# Patient Record
Sex: Male | Born: 1950 | Race: Black or African American | Hispanic: No | Marital: Single | Smoking: Current every day smoker
Health system: Southern US, Community
[De-identification: ages and names within clinical notes are randomized; demographics above are authoritative.]

## PROBLEM LIST (undated history)

## (undated) DIAGNOSIS — I10 Essential (primary) hypertension: Secondary | ICD-10-CM

## (undated) DIAGNOSIS — M109 Gout, unspecified: Secondary | ICD-10-CM

## (undated) DIAGNOSIS — K219 Gastro-esophageal reflux disease without esophagitis: Secondary | ICD-10-CM

## (undated) DIAGNOSIS — C61 Malignant neoplasm of prostate: Secondary | ICD-10-CM

## (undated) DIAGNOSIS — E785 Hyperlipidemia, unspecified: Secondary | ICD-10-CM

## (undated) DIAGNOSIS — M1990 Unspecified osteoarthritis, unspecified site: Secondary | ICD-10-CM

## (undated) HISTORY — PX: PROSTATE BIOPSY: SHX241

## (undated) HISTORY — PX: OTHER SURGICAL HISTORY: SHX169

---

## 2014-06-15 ENCOUNTER — Ambulatory Visit (INDEPENDENT_AMBULATORY_CARE_PROVIDER_SITE_OTHER): Payer: BC Managed Care – PPO | Admitting: Urology

## 2014-06-15 ENCOUNTER — Other Ambulatory Visit: Payer: Self-pay | Admitting: Urology

## 2014-06-15 DIAGNOSIS — R300 Dysuria: Secondary | ICD-10-CM

## 2014-06-15 DIAGNOSIS — N4340 Spermatocele of epididymis, unspecified: Secondary | ICD-10-CM

## 2014-06-15 DIAGNOSIS — R972 Elevated prostate specific antigen [PSA]: Secondary | ICD-10-CM

## 2014-06-15 DIAGNOSIS — R310 Gross hematuria: Secondary | ICD-10-CM

## 2014-08-10 ENCOUNTER — Other Ambulatory Visit: Payer: Self-pay | Admitting: Urology

## 2014-08-10 DIAGNOSIS — R972 Elevated prostate specific antigen [PSA]: Secondary | ICD-10-CM

## 2014-08-24 ENCOUNTER — Ambulatory Visit (HOSPITAL_COMMUNITY)
Admission: RE | Admit: 2014-08-24 | Discharge: 2014-08-24 | Disposition: A | Discharge status: Home / Self Care | Payer: BC Managed Care – PPO | Source: Ambulatory Visit | Attending: Urology | Admitting: Urology

## 2014-08-24 DIAGNOSIS — R972 Elevated prostate specific antigen [PSA]: Secondary | ICD-10-CM | POA: Insufficient documentation

## 2014-08-24 MED ORDER — LIDOCAINE HCL (PF) 2 % IJ SOLN
INTRAMUSCULAR | Status: AC
  Administered 2014-08-24: 10 mL
  Filled 2014-08-24: qty 10

## 2014-08-24 MED ORDER — GENTAMICIN SULFATE 40 MG/ML IJ SOLN
INTRAMUSCULAR | Status: AC
  Administered 2014-08-24: 160 mg
  Filled 2014-08-24: qty 4

## 2014-08-24 NOTE — Discharge Instructions (Signed)
Transrectal Ultrasound-Guided Biopsy °A transrectal ultrasound-guided biopsy is a procedure to remove samples of tissue from your prostate using ultrasound images to guide the procedure. The procedure is usually done to evaluate the prostate gland of men who have an elevated prostate-specific antigen (PSA). PSA is a blood test to screen for prostate cancer. The biopsy samples are taken to check for prostate cancer.  °LET YOUR HEALTH CARE PROVIDER KNOW ABOUT: °· Any allergies you have. °· All medicines you are taking, including vitamins, herbs, eye drops, creams, and over-the-counter medicines. °· Previous problems you or members of your family have had with the use of anesthetics. °· Any blood disorders you have. °· Previous surgeries you have had. °· Medical conditions you have. °RISKS AND COMPLICATIONS °Generally, this is a safe procedure. However, as with any procedure, problems can occur. Possible problems include: °· Infection of your prostate. °· Bleeding from your rectum or blood in your urine. °· Difficulty urinating. °· Nerve damage (this is usually temporary). °· Damage to surrounding structures such as blood vessels, organs, and muscles, which would require other procedures. °BEFORE THE PROCEDURE °· Do not eat or drink anything after midnight on the night before the procedure or as directed by your health care provider. °· Take medicines only as directed by your health care provider. °· Your health care provider may have you stop taking certain medicines 5-7 days before the procedure. °· You will be given an enema before the procedure. During an enema, a liquid is injected into your rectum to clear out waste. °· You may have lab tests the day of your procedure.   °· Plan to have someone take you home after the procedure. °PROCEDURE  °· You will be given medicine to help you relax (sedative) before the procedure. An IV tube will be inserted into one of your veins and used to give fluids and  medicine. °· You will be given antibiotic medicine to reduce the risk of an infection. °· You will be placed on your side for the procedure. °· A probe with lubricated gel will be placed into your rectum, and images will be taken of your prostate and surrounding structures. °· Numbing medicine will be injected into the prostate before the biopsy samples are taken. °· A biopsy needle will then be inserted and guided to your prostate with the use of the ultrasound images. °· Samples of prostate tissue will be taken, and the needle will then be removed. °· The biopsy samples will be sent to a lab to be analyzed. Results are usually back in 2-3 days. °AFTER THE PROCEDURE °· You will be taken to a recovery area where you will be monitored. °· You may have some discomfort in the rectal area. You will be given pain medicines to control this. °· You may be allowed to go home the same day, or you may need to stay in the hospital overnight. °Document Released: 12/18/2013 Document Reviewed: 03/22/2013 °Exit[employer]® Patient Information ©2015 [employer]. This information is not intended to replace advice given to you by your health care provider. Make sure you discuss any questions you have with your health care provider. ° °

## 2014-08-31 ENCOUNTER — Ambulatory Visit (INDEPENDENT_AMBULATORY_CARE_PROVIDER_SITE_OTHER): Payer: BC Managed Care – PPO | Admitting: Urology

## 2014-08-31 DIAGNOSIS — N402 Nodular prostate without lower urinary tract symptoms: Secondary | ICD-10-CM

## 2014-08-31 DIAGNOSIS — R972 Elevated prostate specific antigen [PSA]: Secondary | ICD-10-CM

## 2015-11-13 ENCOUNTER — Encounter: Payer: Self-pay | Admitting: Orthopaedic Surgery

## 2015-11-13 ENCOUNTER — Ambulatory Visit (INDEPENDENT_AMBULATORY_CARE_PROVIDER_SITE_OTHER): Payer: BC Managed Care – PPO

## 2015-11-13 ENCOUNTER — Ambulatory Visit (INDEPENDENT_AMBULATORY_CARE_PROVIDER_SITE_OTHER): Payer: BC Managed Care – PPO | Admitting: Orthopaedic Surgery

## 2015-11-13 VITALS — BP 155/76 | HR 83 | Temp 98.1°F | Resp 14 | Ht 74.0 in | Wt 191.0 lb

## 2015-11-13 DIAGNOSIS — M25551 Pain in right hip: Secondary | ICD-10-CM | POA: Diagnosis not present

## 2015-11-13 DIAGNOSIS — M899 Disorder of bone, unspecified: Secondary | ICD-10-CM | POA: Diagnosis not present

## 2015-11-13 NOTE — Patient Instructions (Signed)
WE WILL SCHEDULE MRI FOR YOU AND CALL YOU WITH APPOINTMENT

## 2015-11-13 NOTE — Progress Notes (Signed)
Subjective: right hip pain    Patient ID: Donald Pollard, male    DOB: 1951/05/24, 65 y.o.   MRN: [id number]  Leg Pain  The incident occurred more than 1 week ago. There was no injury mechanism. The pain is present in the right hip. The quality of the pain is described as aching. The pain is at a severity of 2/10. The pain is mild. The pain has been fluctuating since onset. Pertinent negatives include no loss of sensation, muscle weakness, numbness or tingling. The symptoms are aggravated by weight bearing. He has tried nothing for the symptoms. The treatment provided mild relief.   He has had intermittent pain in the right hip for several months.  He was seen by his family doctor, [facility], on 10/16/15.  X-rays then showed a rounded lucency in right femoral head with sclerotic margins.  Possibility of neoplasm was raised.  Orthopaedic referral was advised.  He has no trauma.  His hip feels well the last couple of weeks.  When he had the pain it was a 7 of a 10. It was deep and throbbing.   Review of Systems  Constitutional:       He does not have diabetes. He has hypertension. He has no shortness of breath. He smokes regularly.   HENT: Negative for congestion.   Respiratory: Negative for cough and shortness of breath.   Cardiovascular: Negative for chest pain and leg swelling.  Endocrine: Negative for cold intolerance.  Musculoskeletal: Positive for arthralgias and gait problem.  Allergic/Immunologic: Negative for environmental allergies.  Neurological: Negative for tingling and numbness.   No past medical history on file. No past surgical history on file.  Social History   Social History  . Marital Status: Divorced    Spouse Name: N/A  . Number of Children: N/A  . Years of Education: N/A   Occupational History  . Not on file.   Social History Main Topics  . Smoking status: Current Every Day Smoker  . Smokeless tobacco: Never Used  . Alcohol Use: No  .  Drug Use: No  . Sexual Activity:    Partners: Female   Other Topics Concern  . Not on file   Social History Narrative  . No narrative on file   BP 155/76 mmHg  Pulse 83  Temp(Src) 98.1 F (36.7 C) (Tympanic)  Resp 14  Ht 6\' 2"  (1.88 m)  Wt 191 lb (86.637 kg)  BMI 24.51 kg/m2     Objective:   Physical Exam  Constitutional: He is oriented to person, place, and time. He appears well-developed and well-nourished.  HENT:  Head: Normocephalic and atraumatic.  Eyes: Conjunctivae and EOM are normal. Pupils are equal, round, and reactive to light.  Neck: Normal range of motion. Neck supple.  Cardiovascular: Normal rate, regular rhythm and intact distal pulses.   Pulmonary/Chest: Effort normal.  Abdominal: Soft.  Musculoskeletal: Normal range of motion.  He has no pain in the right hip and full ROM.  No pain of the trochanteric area.  Neurological: He is alert and oriented to person, place, and time. He has normal reflexes. No cranial nerve deficit. He exhibits normal muscle tone. Coordination normal.  Skin: Skin is warm and dry.  Psychiatric: He has a normal mood and affect. His behavior is normal. Judgment and thought content normal.   Encounter Diagnoses  Name Primary?  . Right hip pain Yes  . Bone lesion       Assessment & Plan:  I have shown him the x-rays of the right hip done today in the office.  I showed him the bony lesion.  I have explained possible seriousness of this.  I have ordered a MRI of the hip.  He may need biopsy or possible surgery depending on findings.  He appears to understand.  Return after the MRI.

## 2015-11-28 ENCOUNTER — Ambulatory Visit (HOSPITAL_COMMUNITY)
Admission: RE | Admit: 2015-11-28 | Discharge: 2015-11-28 | Disposition: A | Discharge status: Home / Self Care | Payer: BC Managed Care – PPO | Source: Ambulatory Visit | Attending: Orthopaedic Surgery | Admitting: Orthopaedic Surgery

## 2015-11-28 DIAGNOSIS — M899 Disorder of bone, unspecified: Secondary | ICD-10-CM

## 2015-12-03 ENCOUNTER — Ambulatory Visit: Payer: BC Managed Care – PPO | Admitting: Orthopaedic Surgery

## 2015-12-20 ENCOUNTER — Ambulatory Visit (INDEPENDENT_AMBULATORY_CARE_PROVIDER_SITE_OTHER): Payer: BC Managed Care – PPO | Admitting: Urology

## 2015-12-20 DIAGNOSIS — N4340 Spermatocele of epididymis, unspecified: Secondary | ICD-10-CM

## 2015-12-20 DIAGNOSIS — N402 Nodular prostate without lower urinary tract symptoms: Secondary | ICD-10-CM | POA: Diagnosis not present

## 2015-12-20 DIAGNOSIS — R972 Elevated prostate specific antigen [PSA]: Secondary | ICD-10-CM

## 2016-01-06 ENCOUNTER — Ambulatory Visit (HOSPITAL_COMMUNITY)
Admission: RE | Admit: 2016-01-06 | Discharge: 2016-01-06 | Disposition: A | Discharge status: Home / Self Care | Payer: BC Managed Care – PPO | Source: Ambulatory Visit | Attending: Orthopaedic Surgery | Admitting: Orthopaedic Surgery

## 2016-01-06 DIAGNOSIS — S76011A Strain of muscle, fascia and tendon of right hip, initial encounter: Secondary | ICD-10-CM | POA: Insufficient documentation

## 2016-01-06 DIAGNOSIS — M948X5 Other specified disorders of cartilage, thigh: Secondary | ICD-10-CM | POA: Insufficient documentation

## 2016-01-06 DIAGNOSIS — M899 Disorder of bone, unspecified: Secondary | ICD-10-CM | POA: Diagnosis not present

## 2016-01-06 LAB — POCT I-STAT CREATININE: CREATININE: 1.3 mg/dL — AB (ref 0.61–1.24)

## 2016-01-06 MED ORDER — GADOBENATE DIMEGLUMINE 529 MG/ML IV SOLN
18.0000 mL | Freq: Once | INTRAVENOUS | Status: AC | PRN
  Administered 2016-01-06: 18 mL via INTRAVENOUS

## 2016-01-15 ENCOUNTER — Ambulatory Visit (INDEPENDENT_AMBULATORY_CARE_PROVIDER_SITE_OTHER): Payer: BC Managed Care – PPO | Admitting: Orthopaedic Surgery

## 2016-01-15 ENCOUNTER — Encounter: Payer: Self-pay | Admitting: Orthopaedic Surgery

## 2016-01-15 VITALS — BP 136/72 | HR 79 | Temp 97.9°F | Ht 74.0 in | Wt 191.0 lb

## 2016-01-15 DIAGNOSIS — M25551 Pain in right hip: Secondary | ICD-10-CM

## 2016-01-15 DIAGNOSIS — M899 Disorder of bone, unspecified: Secondary | ICD-10-CM

## 2016-01-15 NOTE — Patient Instructions (Signed)
Results of the MRI were discussed. Continue medications. Continue reduced activity.

## 2016-01-15 NOTE — Progress Notes (Signed)
Patient [id number] [name], male DOB:Feb 25, 1951, 65 y.o. [id number]  Chief Complaint  Patient presents with  . Results    MRI Right hip    HPI  Donald Pollard is a 65 y.o. male who is seen in follow-up for right hip pain and bony lesion.  He had a MRI which showed:  IMPRESSION: 1. 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 2. Tendinosis with high-grade partial tear of the right hamstring origin. Tendinosis with a small partial tear of the left hamstring origin. 3. Partial-thickness cartilage loss of the hips bilaterally. 4. Degeneration with possible tear of the superior right labrum.  I have explained the findings to him.  He is not having any pain.  He does not need any surgery.  I will see him in one month. HPI  Body mass index is 24.51 kg/(m^2).  ROS  Review of Systems  Constitutional:       He does not have diabetes. He has hypertension. He has no shortness of breath. He smokes regularly.   HENT: Negative for congestion.   Respiratory: Negative for cough and shortness of breath.   Cardiovascular: Negative for chest pain and leg swelling.  Endocrine: Negative for cold intolerance.  Musculoskeletal: Positive for arthralgias and gait problem.  Allergic/Immunologic: Negative for environmental allergies.  Neurological: Negative for numbness.    History reviewed. No pertinent past medical history.  History reviewed. No pertinent past surgical history.  History reviewed. No pertinent family history.  Social History Social History  Substance Use Topics  . Smoking status: Current Every Day Smoker  . Smokeless tobacco: Never Used  . Alcohol Use: No    No Known Allergies  Current Outpatient Prescriptions  Medication Sig Dispense Refill  . aspirin 81 MG tablet Take 81 mg by mouth daily.    . hydrochlorothiazide  (HYDRODIURIL) 25 MG tablet Take 25 mg by mouth daily.    Marland Kitchen lisinopril (PRINIVIL,ZESTRIL) 10 MG tablet Take 10 mg by mouth daily.    Marland Kitchen lovastatin (MEVACOR) 20 MG tablet Take 20 mg by mouth daily.     No current facility-administered medications for this visit.     Physical Exam  Blood pressure 136/72, pulse 79, temperature 97.9 F (36.6 C), height 6\' 2"  (1.88 m), weight 191 lb (86.637 kg).  Constitutional: overall normal hygiene, normal nutrition, well developed, normal grooming, normal body habitus. Assistive device:none  Musculoskeletal: gait and station Limp none, muscle tone and strength are normal, no tremors or atrophy is present.  .  Neurological: coordination overall normal.  Deep tendon reflex/nerve stretch intact.  Sensation normal.  Cranial nerves II-XII intact.   Skin:   normal overall no scars, lesions, ulcers or rashes. No psoriasis.  Psychiatric: Alert and oriented x 3.  Recent memory intact, remote memory unclear.  Normal mood and affect. Well groomed.  Good eye contact.  Cardiovascular: overall no swelling, no varicosities, no edema bilaterally, normal temperatures of the legs and arms, no clubbing, cyanosis and good capillary refill.  Lymphatic: palpation is normal.  Both hips have no pain, both have full motion, NV intact.  The patient has been educated about the nature of the problem(s) and counseled on treatment options.  The patient appeared to understand what I have discussed and is in agreement with it.  Encounter Diagnoses  Name Primary?  . Right hip pain Yes  . Bone lesion     PLAN Call  if any problems.  Precautions discussed.  Continue current medications.   Return to clinic 1 month

## 2016-03-12 ENCOUNTER — Ambulatory Visit: Payer: BC Managed Care – PPO | Admitting: Orthopaedic Surgery

## 2016-03-18 ENCOUNTER — Ambulatory Visit: Payer: BC Managed Care – PPO | Admitting: Orthopaedic Surgery

## 2016-08-21 ENCOUNTER — Encounter (INDEPENDENT_AMBULATORY_CARE_PROVIDER_SITE_OTHER): Payer: Self-pay | Admitting: *Deleted

## 2017-02-12 ENCOUNTER — Ambulatory Visit: Payer: BC Managed Care – PPO | Admitting: Urology

## 2017-03-26 ENCOUNTER — Ambulatory Visit (INDEPENDENT_AMBULATORY_CARE_PROVIDER_SITE_OTHER): Payer: Medicare Other | Admitting: Urology

## 2017-03-26 DIAGNOSIS — N403 Nodular prostate with lower urinary tract symptoms: Secondary | ICD-10-CM

## 2017-03-26 DIAGNOSIS — R351 Nocturia: Secondary | ICD-10-CM | POA: Diagnosis not present

## 2017-03-26 DIAGNOSIS — R972 Elevated prostate specific antigen [PSA]: Secondary | ICD-10-CM

## 2017-03-29 ENCOUNTER — Other Ambulatory Visit: Payer: Self-pay | Admitting: Urology

## 2017-03-29 DIAGNOSIS — R972 Elevated prostate specific antigen [PSA]: Secondary | ICD-10-CM

## 2017-04-07 ENCOUNTER — Ambulatory Visit (HOSPITAL_COMMUNITY)
Admission: RE | Admit: 2017-04-07 | Discharge: 2017-04-07 | Disposition: A | Discharge status: Home / Self Care | Payer: Medicare Other | Source: Ambulatory Visit | Attending: Urology | Admitting: Urology

## 2017-04-07 DIAGNOSIS — R972 Elevated prostate specific antigen [PSA]: Secondary | ICD-10-CM

## 2017-04-07 DIAGNOSIS — R938 Abnormal findings on diagnostic imaging of other specified body structures: Secondary | ICD-10-CM | POA: Diagnosis not present

## 2017-04-07 DIAGNOSIS — N400 Enlarged prostate without lower urinary tract symptoms: Secondary | ICD-10-CM | POA: Insufficient documentation

## 2017-04-07 LAB — POCT I-STAT CREATININE: Creatinine, Ser: 1.4 mg/dL — ABNORMAL HIGH (ref 0.61–1.24)

## 2017-04-07 MED ORDER — GADOBENATE DIMEGLUMINE 529 MG/ML IV SOLN
18.0000 mL | Freq: Once | INTRAVENOUS | Status: AC | PRN
  Administered 2017-04-07: 18 mL via INTRAVENOUS

## 2017-06-04 ENCOUNTER — Ambulatory Visit (INDEPENDENT_AMBULATORY_CARE_PROVIDER_SITE_OTHER): Payer: Medicare Other | Admitting: Urology

## 2017-06-04 DIAGNOSIS — N403 Nodular prostate with lower urinary tract symptoms: Secondary | ICD-10-CM

## 2017-06-04 DIAGNOSIS — R972 Elevated prostate specific antigen [PSA]: Secondary | ICD-10-CM | POA: Diagnosis not present

## 2017-07-23 ENCOUNTER — Ambulatory Visit: Payer: Medicare Other | Admitting: Urology

## 2017-07-23 DIAGNOSIS — C61 Malignant neoplasm of prostate: Secondary | ICD-10-CM

## 2017-08-18 ENCOUNTER — Encounter: Payer: Self-pay | Admitting: Radiation Oncology

## 2017-08-18 ENCOUNTER — Other Ambulatory Visit: Payer: Self-pay

## 2017-08-18 ENCOUNTER — Ambulatory Visit
Admission: RE | Admit: 2017-08-18 | Discharge: 2017-08-18 | Disposition: A | Discharge status: Home / Self Care | Payer: Medicare Other | Source: Ambulatory Visit | Attending: Radiation Oncology | Admitting: Radiation Oncology

## 2017-08-18 VITALS — BP 139/78 | HR 99 | Temp 98.1°F | Resp 16 | Wt 193.8 lb

## 2017-08-18 DIAGNOSIS — C61 Malignant neoplasm of prostate: Secondary | ICD-10-CM | POA: Diagnosis not present

## 2017-08-18 DIAGNOSIS — Z7982 Long term (current) use of aspirin: Secondary | ICD-10-CM | POA: Insufficient documentation

## 2017-08-18 DIAGNOSIS — Z809 Family history of malignant neoplasm, unspecified: Secondary | ICD-10-CM | POA: Insufficient documentation

## 2017-08-18 DIAGNOSIS — Z79899 Other long term (current) drug therapy: Secondary | ICD-10-CM | POA: Insufficient documentation

## 2017-08-18 DIAGNOSIS — I10 Essential (primary) hypertension: Secondary | ICD-10-CM | POA: Diagnosis not present

## 2017-08-18 DIAGNOSIS — F17210 Nicotine dependence, cigarettes, uncomplicated: Secondary | ICD-10-CM | POA: Insufficient documentation

## 2017-08-18 DIAGNOSIS — E785 Hyperlipidemia, unspecified: Secondary | ICD-10-CM | POA: Insufficient documentation

## 2017-08-18 HISTORY — DX: Essential (primary) hypertension: I10

## 2017-08-18 HISTORY — DX: Hyperlipidemia, unspecified: E78.5

## 2017-08-18 HISTORY — DX: Malignant neoplasm of prostate: C61

## 2017-08-18 NOTE — Progress Notes (Signed)
GU Location of Tumor / Histology: prostatic adenocarcinoma  If Prostate Cancer, Gleason Score is (3 + 4) and PSA is (10.9). Prostate volume: 58 ml  Donald Pollard had a prostate biopsy in 2016 but it was negative. Repeat prostate biopsy wasn't done until 2018.  Biopsies of prostate (if applicable) revealed:    Past/Anticipated interventions by urology, if any: biopsy, denies receiving ADT or casodex, reports he is most interest in radioactive seeds.   Past/Anticipated interventions by medical oncology, if any: no  Weight changes, if any: no  Bowel/Bladder complaints, if any: IPSS 10. Denies dysuria or hematuria. Reports occasional leakage.   Nausea/Vomiting, if any: no  Pain issues, if any:  Reports occasional sharp pain in right groin that is brief and mild.  SAFETY ISSUES:  Prior radiation? no  Pacemaker/ICD? no  Possible current pregnancy? no  Is the patient on methotrexate? no  Current Complaints / other details:  67 year old male. Retired. Divorced. Accompanied today by his three daughters. Lives in [place].

## 2017-08-18 NOTE — Addendum Note (Signed)
Encounter addended by: Romeka Scifres, MD on: 08/18/2017 1:41 PM  Actions taken: LOS modified, Follow-up modified

## 2017-08-18 NOTE — Progress Notes (Signed)
Radiation Oncology         [phone] ________________________________  Initial Outpatient Consultation  Name: Donald Pollard MRN: [id number]  Date: 08/18/2017  DOB: 13-Nov-1950  DX:[id number], Thomasena Edis, PA-C  [place], [place]*   REFERRING PHYSICIAN: Avelino Leeds*  DIAGNOSIS: 67 y.o. 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 Malignant neoplasm of prostate ([place]) C61     HISTORY OF PRESENT ILLNESS::Donald Pollard is a 67 y.o. gentleman with a diagnosis of prostate cancer. He previously had a prostate biopsy done in January 2016 for an elevated PSA of 8.1. Biopsy was negative for carcinoma. Repeat PSA in December 2016 was 6 but more recently, it is elevated again to 10.9 as of July 2018.  Accordingly, he was referred for evaluation in urology by Dr. Irine Seal on 03/26/2017, where a digital rectal examination was performed at that time revealing mild firmness at the right apex and left mid lateral prostate. He underwent an MRI of the prostate on 04/07/2017 and was found to have a PIRADS 3 lesion in the left mid anterio-lateral prostate. The patient then proceeded to MRI-fusion biopsy with 13 biopsies of the prostate on 06/24/2017.  The prostate volume measured 58 cc.  Out of 13 core biopsies, 1 was positive.  The maximum Gleason score was 3+4, and this was seen in the right base.  Biopsies of prostate revealed:    The patient reviewed the biopsy results with his urologist and he has kindly been referred today for discussion of potential radiation treatment options. He is accompanied by his three daughters.   PREVIOUS RADIATION THERAPY: No  PAST MEDICAL HISTORY:  has a past medical history of Hyperlipidemia, Hypertension, and Prostate cancer ([place]).    PAST SURGICAL HISTORY: Past Surgical History:  Procedure Laterality Date  . PROSTATE BIOPSY      FAMILY HISTORY: family history includes  Cancer in his brother and brother.  SOCIAL HISTORY:  reports that he has been smoking cigarettes.  He has a 78.00 pack-year smoking history. he has never used smokeless tobacco. He reports that he does not drink alcohol or use drugs.  ALLERGIES: Patient has no known allergies.  MEDICATIONS:  Current Outpatient Medications  Medication Sig Dispense Refill  . aspirin 81 MG tablet Take 81 mg by mouth daily.    . hydrochlorothiazide (HYDRODIURIL) 25 MG tablet Take 25 mg by mouth daily.    Marland Kitchen lisinopril (PRINIVIL,ZESTRIL) 10 MG tablet Take 10 mg by mouth daily.    Marland Kitchen lovastatin (MEVACOR) 20 MG tablet Take 20 mg by mouth daily.    . saw palmetto 160 MG capsule Take 160 mg by mouth 2 (two) times daily.    . diazepam (VALIUM) 10 MG tablet Take 10 mg by mouth every 6 (six) hours as needed for anxiety.     No current facility-administered medications for this encounter.     REVIEW OF SYSTEMS:  On review of systems, the patient reports that he is doing well overall. He denies any chest pain, shortness of breath, cough, fevers, chills, night sweats, or unintended weight changes. He denies any bowel disturbances, and denies abdominal pain, nausea or vomiting. He reports occasional sharp pain in his right groin that is brief and mild. He denies any new skin lesions or concerns.  The patient completed an IPSS and IIEF questionnaire.  His IPSS score was 10 indicating moderate urinary outflow obstructive  symptoms of nocturia x4, frequency, intermittency, straining, and occasional leakage. He denies any dysuria or hematuria. He indicated mild-moderate ED. A complete review of systems is obtained and is otherwise negative.   PHYSICAL EXAM: This patient is in no acute distress.  He is alert and oriented.   weight is 193 lb 12.8 oz (87.9 kg). His oral temperature is 98.1 F (36.7 C). His blood pressure is 139/78 and his pulse is 99. His respiration is 16 and oxygen saturation is 100%.  He exhibits no respiratory  distress or labored breathing.  He appears neurologically intact.  His mood is pleasant.  His affect is appropriate.  Please note the digital rectal exam findings described above.  KPS = 100  100 - Normal; no complaints; no evidence of disease. 90   - Able to carry on normal activity; minor signs or symptoms of disease. 80   - Normal activity with effort; some signs or symptoms of disease. 42   - Cares for self; unable to carry on normal activity or to do active work. 60   - Requires occasional assistance, but is able to care for most of his personal needs. 50   - Requires considerable assistance and frequent medical care. 6   - Disabled; requires special care and assistance. 48   - Severely disabled; hospital admission is indicated although death not imminent. 63   - Very sick; hospital admission necessary; active supportive treatment necessary. 10   - Moribund; fatal processes progressing rapidly. 0     - Dead  Karnofsky DA, Abelmann WH, Craver LS and Burchenal JH [phone]) The use of the nitrogen mustards in the palliative treatment of carcinoma: with particular reference to bronchogenic carcinoma Cancer 1 [phone]   LABORATORY DATA:  No results found for: WBC, HGB, HCT, MCV, PLT No results found for: NA, K, CL, CO2 No results found for: ALT, AST, GGT, ALKPHOS, BILITOT   RADIOGRAPHY: No results found.    IMPRESSION: This is a 67 y.o. 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  His T-Stage, Gleason's Score, and PSA put him into the intermediate risk group. Accordingly, he is eligible for a variety of potential treatment options including brachytherapy or 8 weeks of external radiation. We discussed the available radiation techniques, and focused on the details and logistics and delivery. We discussed and outlined the risks, benefits, short and long-term effects associated with radiotherapy and compared and contrasted these with  prostatectomy. We discussed the role of SpaceOAR in reducing the rectal toxicity associated with radiotherapy.  PLAN: At the conclusion of our conversation, the patient is interested in moving forward with brachytherapy and use of SpaceOAR to reduce rectal toxicity from radiotherapy.  We will share our discussion with Dr. Jeffie Pollock and move forward with scheduling his [facility] planning appointment in the near future.  The patient met briefly with Romie Jumper in our office who will be working closely with him to coordinate OR scheduling and pre and post procedure appointments.  We will contact the pharmaceutical rep to ensure that [place] is available at the time of procedure.  He will have a prostate MRI following his post-seed CT SIM to confirm appropriate distribution of the [place].  I spent more than 50% of our time in counseling and/or coordination of care.   ------------------------------------------------  Sheral Apley. Tammi Klippel, M.D.  This document serves as a record of services personally performed by Tyler Pita, MD. It was  created on his behalf by Rae Lips, a trained medical scribe. The creation of this record is based on the scribe's personal observations and the provider's statements to them. This document has been checked and approved by the attending provider.

## 2017-08-18 NOTE — Progress Notes (Signed)
See progress note under physician encounter. 

## 2017-08-20 ENCOUNTER — Telehealth: Payer: Self-pay | Admitting: *Deleted

## 2017-08-20 NOTE — Telephone Encounter (Signed)
CALLED PATIENT TO INFORM OF PRE-SEED PLANNING CT FOR 09-10-17 @ 2 PM, SPOKE WITH PATIENT AND HE IS AWARE OF THIS APPT.

## 2017-08-24 ENCOUNTER — Other Ambulatory Visit: Payer: Self-pay | Admitting: Urology

## 2017-08-24 ENCOUNTER — Telehealth: Payer: Self-pay | Admitting: *Deleted

## 2017-08-24 NOTE — Telephone Encounter (Signed)
Called patient to inform of implant date, spoke with patient and he is aware of this date. 

## 2017-09-09 ENCOUNTER — Telehealth: Payer: Self-pay | Admitting: *Deleted

## 2017-09-09 NOTE — Telephone Encounter (Signed)
CALLED PATIENT TO REMIND OF PRE-SEED APPTS. FOR 09-10-17, SPOKE WITH PATIENT AND HE IS AWARE OF THESE APPTS. 

## 2017-09-10 ENCOUNTER — Ambulatory Visit
Admission: RE | Admit: 2017-09-10 | Discharge: 2017-09-10 | Disposition: A | Discharge status: Home / Self Care | Payer: Medicare Other | Source: Ambulatory Visit | Attending: Radiation Oncology | Admitting: Radiation Oncology

## 2017-09-10 ENCOUNTER — Ambulatory Visit (HOSPITAL_COMMUNITY)
Admission: RE | Admit: 2017-09-10 | Discharge: 2017-09-10 | Disposition: A | Discharge status: Home / Self Care | Payer: Medicare Other | Source: Ambulatory Visit | Attending: Urology | Admitting: Urology

## 2017-09-10 ENCOUNTER — Other Ambulatory Visit: Payer: Self-pay | Admitting: Urology

## 2017-09-10 ENCOUNTER — Encounter (HOSPITAL_COMMUNITY)
Admission: RE | Admit: 2017-09-10 | Discharge: 2017-09-10 | Disposition: A | Discharge status: Home / Self Care | Payer: Medicare Other | Source: Ambulatory Visit | Attending: Urology | Admitting: Urology

## 2017-09-10 DIAGNOSIS — C61 Malignant neoplasm of prostate: Secondary | ICD-10-CM | POA: Diagnosis not present

## 2017-09-10 DIAGNOSIS — Z01818 Encounter for other preprocedural examination: Secondary | ICD-10-CM | POA: Insufficient documentation

## 2017-09-10 NOTE — Progress Notes (Signed)
  Radiation Oncology         [phone]) [phone] ________________________________  Name: Donald Pollard MRN: [id number]  Date: 09/10/2017  DOB: 1950/11/24  SIMULATION AND TREATMENT PLANNING NOTE PUBIC ARCH STUDY  UO:[id number], Thomasena Edis, PA-C  Avelino Leeds*  DIAGNOSIS: 67 y.o. 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 Malignant neoplasm of prostate ([place]) C61     COMPLEX SIMULATION:  The patient presented today for evaluation for possible prostate seed implant. He was brought to the radiation planning suite and placed supine on the CT couch. A 3-dimensional image study set was obtained in upload to the planning computer. There, on each axial slice, I contoured the prostate gland. Then, using three-dimensional radiation planning tools I reconstructed the prostate in view of the structures from the transperineal needle pathway to assess for possible pubic arch interference. In doing so, I did not appreciate any pubic arch interference. Also, the patient's prostate volume was estimated based on the drawn structure. The volume was 59 cc.  Given the pubic arch appearance and prostate volume, patient remains a good candidate to proceed with prostate seed implant. Today, he freely provided informed written consent to proceed.    PLAN: The patient will undergo definitive prostate seed implant to 145 Gy   ________________________________  Sheral Apley. Tammi Klippel, M.D.

## 2017-09-17 ENCOUNTER — Telehealth: Payer: Self-pay | Admitting: Medical Oncology

## 2017-09-17 NOTE — Telephone Encounter (Signed)
Spoke with Donald Pollard and he states he is ready to get the brachytherapy behind him. He was pre-op and pre CT simulation 09/10/17. All questions were answered and I asked him to call me if any questions or concerns arise between now and 11/11/17 day of seed implant. He voiced understanding.

## 2017-10-19 ENCOUNTER — Telehealth: Payer: Self-pay | Admitting: *Deleted

## 2017-10-19 NOTE — Telephone Encounter (Signed)
CALLED PATIENT TO INFORM OF LAB APPT. FOR 11-04-17 - ARRIVAL TIME - 1:45 PM @ [facility] ADMITTING, SPOKE WITH PATIENT AND HE IS AWARE OF THIS APPT.

## 2017-11-01 ENCOUNTER — Encounter (HOSPITAL_BASED_OUTPATIENT_CLINIC_OR_DEPARTMENT_OTHER): Payer: Self-pay | Admitting: *Deleted

## 2017-11-01 ENCOUNTER — Other Ambulatory Visit: Payer: Self-pay

## 2017-11-01 NOTE — Progress Notes (Addendum)
Spoke with [name] after midnight arrive 530 am 11-11-17 [facility] meds to take, none, fleets enema am of surgery Driver daughter ainsley sanguinetti will stay for surgery Patient aware has lab appointment 11-04-17 for cbc, cmet, pt, ptt,  Has ekg and chest xray done 09-10-17 on chart/epic

## 2017-11-03 ENCOUNTER — Telehealth: Payer: Self-pay | Admitting: *Deleted

## 2017-11-03 NOTE — Telephone Encounter (Signed)
Called patient to remind of labs for 11-04-17 @ 2 pm @ [facility] , spoke with patient and he is aware of this appt.

## 2017-11-04 ENCOUNTER — Encounter (HOSPITAL_COMMUNITY)
Admission: RE | Admit: 2017-11-04 | Discharge: 2017-11-04 | Disposition: A | Discharge status: Home / Self Care | Payer: Medicare Other | Source: Ambulatory Visit | Attending: Urology | Admitting: Urology

## 2017-11-04 DIAGNOSIS — Z01812 Encounter for preprocedural laboratory examination: Secondary | ICD-10-CM | POA: Diagnosis present

## 2017-11-04 LAB — COMPREHENSIVE METABOLIC PANEL
ALK PHOS: 82 U/L (ref 38–126)
ALT: 31 U/L (ref 17–63)
ANION GAP: 9 (ref 5–15)
AST: 26 U/L (ref 15–41)
Albumin: 4.1 g/dL (ref 3.5–5.0)
BILIRUBIN TOTAL: 0.5 mg/dL (ref 0.3–1.2)
BUN: 25 mg/dL — ABNORMAL HIGH (ref 6–20)
CALCIUM: 9.4 mg/dL (ref 8.9–10.3)
CO2: 25 mmol/L (ref 22–32)
Chloride: 107 mmol/L (ref 101–111)
Creatinine, Ser: 1.32 mg/dL — ABNORMAL HIGH (ref 0.61–1.24)
GFR, EST NON AFRICAN AMERICAN: 55 mL/min — AB (ref >60)
Glucose, Bld: 79 mg/dL (ref 65–99)
Potassium: 4.8 mmol/L (ref 3.5–5.1)
Sodium: 141 mmol/L (ref 135–145)
TOTAL PROTEIN: 7.2 g/dL (ref 6.5–8.1)

## 2017-11-04 LAB — CBC
HEMATOCRIT: 39.5 % (ref 39.0–52.0)
HEMOGLOBIN: 12.9 g/dL — AB (ref 13.0–17.0)
MCH: 30.1 pg (ref 26.0–34.0)
MCHC: 32.7 g/dL (ref 30.0–36.0)
MCV: 92.3 fL (ref 78.0–100.0)
Platelets: 286 10*3/uL (ref 150–400)
RBC: 4.28 MIL/uL (ref 4.22–5.81)
RDW: 15.1 % (ref 11.5–15.5)
WBC: 10.6 10*3/uL — ABNORMAL HIGH (ref 4.0–10.5)

## 2017-11-04 LAB — PROTIME-INR
INR: 0.98
Prothrombin Time: 12.9 seconds (ref 11.4–15.2)

## 2017-11-04 LAB — APTT: aPTT: 32 seconds (ref 24–36)

## 2017-11-10 ENCOUNTER — Telehealth: Payer: Self-pay | Admitting: *Deleted

## 2017-11-10 NOTE — Telephone Encounter (Signed)
Called patient to remind of procedure for 11-11-17, spoke with patient and he is aware of this procedure

## 2017-11-10 NOTE — Anesthesia Preprocedure Evaluation (Addendum)
Anesthesia Evaluation  Patient identified by MRN, date of birth, ID band Patient awake    Reviewed: Allergy & Precautions, NPO status , Patient's Chart, lab work & pertinent test results  Airway Mallampati: II  TM Distance: >3 FB Neck ROM: Full    Dental no notable dental hx. (+) Edentulous Upper, Missing Only one lower incisor  :   Pulmonary neg pulmonary ROS, Current Smoker,    Pulmonary exam normal breath sounds clear to auscultation       Cardiovascular hypertension, Pt. on medications Normal cardiovascular exam Rhythm:Regular Rate:Normal     Neuro/Psych negative neurological ROS  negative psych ROS   GI/Hepatic Neg liver ROS, GERD  ,  Endo/Other  negative endocrine ROS  Renal/GU negative Renal ROS  negative genitourinary   Musculoskeletal  (+) Arthritis ,   Abdominal   Peds negative pediatric ROS (+)  Hematology negative hematology ROS (+)   Anesthesia Other Findings   Reproductive/Obstetrics negative OB ROS                            Anesthesia Physical Anesthesia Plan  ASA: II  Anesthesia Plan: General   Post-op Pain Management:    Induction: Intravenous  PONV Risk Score and Plan:   Airway Management Planned: Oral ETT  Additional Equipment:   Intra-op Plan:   Post-operative Plan: Extubation in OR  Informed Consent:   Plan Discussed with:   Anesthesia Plan Comments: (  )        Anesthesia Quick Evaluation

## 2017-11-11 ENCOUNTER — Ambulatory Visit (HOSPITAL_BASED_OUTPATIENT_CLINIC_OR_DEPARTMENT_OTHER): Payer: Medicare Other | Admitting: Certified Registered"

## 2017-11-11 ENCOUNTER — Ambulatory Visit (HOSPITAL_BASED_OUTPATIENT_CLINIC_OR_DEPARTMENT_OTHER)
Admission: RE | Admit: 2017-11-11 | Discharge: 2017-11-11 | Disposition: A | Discharge status: Home / Self Care | Payer: Medicare Other | Source: Ambulatory Visit | Attending: Urology | Admitting: Urology

## 2017-11-11 ENCOUNTER — Encounter (HOSPITAL_BASED_OUTPATIENT_CLINIC_OR_DEPARTMENT_OTHER)
Admission: RE | Disposition: A | Discharge status: Home / Self Care | Payer: Self-pay | Source: Ambulatory Visit | Attending: Urology

## 2017-11-11 ENCOUNTER — Ambulatory Visit (HOSPITAL_COMMUNITY): Payer: Medicare Other

## 2017-11-11 ENCOUNTER — Encounter (HOSPITAL_BASED_OUTPATIENT_CLINIC_OR_DEPARTMENT_OTHER): Payer: Self-pay | Admitting: *Deleted

## 2017-11-11 ENCOUNTER — Other Ambulatory Visit: Payer: Self-pay

## 2017-11-11 DIAGNOSIS — C61 Malignant neoplasm of prostate: Secondary | ICD-10-CM | POA: Insufficient documentation

## 2017-11-11 DIAGNOSIS — F17210 Nicotine dependence, cigarettes, uncomplicated: Secondary | ICD-10-CM | POA: Insufficient documentation

## 2017-11-11 DIAGNOSIS — M1990 Unspecified osteoarthritis, unspecified site: Secondary | ICD-10-CM | POA: Insufficient documentation

## 2017-11-11 DIAGNOSIS — K219 Gastro-esophageal reflux disease without esophagitis: Secondary | ICD-10-CM | POA: Insufficient documentation

## 2017-11-11 DIAGNOSIS — Z79899 Other long term (current) drug therapy: Secondary | ICD-10-CM | POA: Diagnosis not present

## 2017-11-11 DIAGNOSIS — Z7982 Long term (current) use of aspirin: Secondary | ICD-10-CM | POA: Diagnosis not present

## 2017-11-11 DIAGNOSIS — I10 Essential (primary) hypertension: Secondary | ICD-10-CM | POA: Insufficient documentation

## 2017-11-11 HISTORY — DX: Gastro-esophageal reflux disease without esophagitis: K21.9

## 2017-11-11 HISTORY — DX: Unspecified osteoarthritis, unspecified site: M19.90

## 2017-11-11 HISTORY — PX: RADIOACTIVE SEED IMPLANT: SHX5150

## 2017-11-11 HISTORY — PX: SPACE OAR INSTILLATION: SHX6769

## 2017-11-11 SURGERY — INSERTION, RADIATION SOURCE, PROSTATE
Anesthesia: General

## 2017-11-11 MED ORDER — CIPROFLOXACIN IN D5W 400 MG/200ML IV SOLN
400.0000 mg | INTRAVENOUS | Status: AC
  Administered 2017-11-11: 400 mg via INTRAVENOUS
  Filled 2017-11-11: qty 200

## 2017-11-11 MED ORDER — FENTANYL CITRATE (PF) 100 MCG/2ML IJ SOLN
INTRAMUSCULAR | Status: DC | PRN
  Administered 2017-11-11 (×2): 50 ug via INTRAVENOUS

## 2017-11-11 MED ORDER — IOHEXOL 300 MG/ML  SOLN
INTRAMUSCULAR | Status: DC | PRN
  Administered 2017-11-11: 7 mL via URETHRAL

## 2017-11-11 MED ORDER — LIDOCAINE 2% (20 MG/ML) 5 ML SYRINGE
INTRAMUSCULAR | Status: DC | PRN
  Administered 2017-11-11: 80 mg via INTRAVENOUS

## 2017-11-11 MED ORDER — ROCURONIUM BROMIDE 10 MG/ML (PF) SYRINGE
PREFILLED_SYRINGE | INTRAVENOUS | Status: AC
  Filled 2017-11-11: qty 5

## 2017-11-11 MED ORDER — HYDROMORPHONE HCL 2 MG/ML IJ SOLN
INTRAMUSCULAR | Status: AC
  Filled 2017-11-11: qty 1

## 2017-11-11 MED ORDER — SUGAMMADEX SODIUM 200 MG/2ML IV SOLN
INTRAVENOUS | Status: AC
  Filled 2017-11-11: qty 2

## 2017-11-11 MED ORDER — OXYCODONE HCL 5 MG PO TABS
5.0000 mg | ORAL_TABLET | Freq: Once | ORAL | Status: DC | PRN
  Filled 2017-11-11: qty 1

## 2017-11-11 MED ORDER — MEPERIDINE HCL 25 MG/ML IJ SOLN
6.2500 mg | INTRAMUSCULAR | Status: DC | PRN
Start: 2017-11-11 — End: 2017-11-11
  Filled 2017-11-11: qty 1

## 2017-11-11 MED ORDER — MIDAZOLAM HCL 2 MG/2ML IJ SOLN
INTRAMUSCULAR | Status: AC
Start: 2017-11-11 — End: ?
  Filled 2017-11-11: qty 2

## 2017-11-11 MED ORDER — CIPROFLOXACIN IN D5W 400 MG/200ML IV SOLN
INTRAVENOUS | Status: AC
  Filled 2017-11-11: qty 200

## 2017-11-11 MED ORDER — ACETAMINOPHEN 325 MG PO TABS
325.0000 mg | ORAL_TABLET | ORAL | Status: DC | PRN
  Filled 2017-11-11: qty 2

## 2017-11-11 MED ORDER — OXYCODONE HCL 5 MG/5ML PO SOLN
5.0000 mg | Freq: Once | ORAL | Status: DC | PRN
  Filled 2017-11-11: qty 5

## 2017-11-11 MED ORDER — FENTANYL CITRATE (PF) 100 MCG/2ML IJ SOLN
INTRAMUSCULAR | Status: AC
  Filled 2017-11-11: qty 2

## 2017-11-11 MED ORDER — EPHEDRINE 5 MG/ML INJ
INTRAVENOUS | Status: AC
  Filled 2017-11-11: qty 10

## 2017-11-11 MED ORDER — PROPOFOL 10 MG/ML IV BOLUS
INTRAVENOUS | Status: AC
  Filled 2017-11-11: qty 20

## 2017-11-11 MED ORDER — ONDANSETRON HCL 4 MG/2ML IJ SOLN
INTRAMUSCULAR | Status: AC
  Filled 2017-11-11: qty 2

## 2017-11-11 MED ORDER — ONDANSETRON HCL 4 MG/2ML IJ SOLN
4.0000 mg | Freq: Once | INTRAMUSCULAR | Status: DC | PRN
  Filled 2017-11-11: qty 2

## 2017-11-11 MED ORDER — TAMSULOSIN HCL 0.4 MG PO CAPS: 0.4000 mg | ORAL_CAPSULE | Freq: Every day | ORAL | 1 refills | Status: DC

## 2017-11-11 MED ORDER — PHENYLEPHRINE 40 MCG/ML (10ML) SYRINGE FOR IV PUSH (FOR BLOOD PRESSURE SUPPORT)
PREFILLED_SYRINGE | INTRAVENOUS | Status: DC | PRN
  Administered 2017-11-11: 40 ug via INTRAVENOUS
  Administered 2017-11-11: 80 ug via INTRAVENOUS
  Administered 2017-11-11 (×3): 40 ug via INTRAVENOUS
  Administered 2017-11-11: 80 ug via INTRAVENOUS

## 2017-11-11 MED ORDER — SODIUM CHLORIDE 0.9 % IV SOLN
INTRAVENOUS | Status: AC | PRN
  Administered 2017-11-11: 1000 mL

## 2017-11-11 MED ORDER — PHENYLEPHRINE 40 MCG/ML (10ML) SYRINGE FOR IV PUSH (FOR BLOOD PRESSURE SUPPORT)
PREFILLED_SYRINGE | INTRAVENOUS | Status: AC
  Filled 2017-11-11: qty 10

## 2017-11-11 MED ORDER — FENTANYL CITRATE (PF) 100 MCG/2ML IJ SOLN
25.0000 ug | INTRAMUSCULAR | Status: DC | PRN
  Filled 2017-11-11: qty 1

## 2017-11-11 MED ORDER — MIDAZOLAM HCL 5 MG/5ML IJ SOLN
INTRAMUSCULAR | Status: DC | PRN
  Administered 2017-11-11: 2 mg via INTRAVENOUS

## 2017-11-11 MED ORDER — PROPOFOL 10 MG/ML IV BOLUS
INTRAVENOUS | Status: DC | PRN
  Administered 2017-11-11: 150 mg via INTRAVENOUS

## 2017-11-11 MED ORDER — ONDANSETRON HCL 4 MG/2ML IJ SOLN
INTRAMUSCULAR | Status: DC | PRN
  Administered 2017-11-11: 4 mg via INTRAVENOUS

## 2017-11-11 MED ORDER — DEXAMETHASONE SODIUM PHOSPHATE 10 MG/ML IJ SOLN
INTRAMUSCULAR | Status: AC
  Filled 2017-11-11: qty 1

## 2017-11-11 MED ORDER — ROCURONIUM BROMIDE 100 MG/10ML IV SOLN
INTRAVENOUS | Status: DC | PRN
  Administered 2017-11-11: 40 mg via INTRAVENOUS
  Administered 2017-11-11: 10 mg via INTRAVENOUS

## 2017-11-11 MED ORDER — FLEET ENEMA 7-19 GM/118ML RE ENEM
1.0000 | ENEMA | Freq: Once | RECTAL | Status: AC
  Administered 2017-11-11: 1 via RECTAL
  Filled 2017-11-11: qty 1

## 2017-11-11 MED ORDER — SUGAMMADEX SODIUM 200 MG/2ML IV SOLN
INTRAVENOUS | Status: DC | PRN
  Administered 2017-11-11: 200 mg via INTRAVENOUS

## 2017-11-11 MED ORDER — KETOROLAC TROMETHAMINE 30 MG/ML IJ SOLN
INTRAMUSCULAR | Status: AC
  Filled 2017-11-11: qty 1

## 2017-11-11 MED ORDER — DEXAMETHASONE SODIUM PHOSPHATE 10 MG/ML IJ SOLN
INTRAMUSCULAR | Status: DC | PRN
  Administered 2017-11-11: 10 mg via INTRAVENOUS

## 2017-11-11 MED ORDER — LIDOCAINE 2% (20 MG/ML) 5 ML SYRINGE
INTRAMUSCULAR | Status: AC
  Filled 2017-11-11: qty 5

## 2017-11-11 MED ORDER — SODIUM CHLORIDE 0.9 % IJ SOLN
INTRAMUSCULAR | Status: DC | PRN
  Administered 2017-11-11: 10 mL

## 2017-11-11 MED ORDER — HYDROCODONE-ACETAMINOPHEN 5-325 MG PO TABS: 1.0000 | ORAL_TABLET | Freq: Four times a day (QID) | ORAL | 0 refills | Status: AC | PRN

## 2017-11-11 MED ORDER — ACETAMINOPHEN 160 MG/5ML PO SOLN
325.0000 mg | ORAL | Status: DC | PRN
  Filled 2017-11-11: qty 20.3

## 2017-11-11 MED ORDER — LACTATED RINGERS IV SOLN
INTRAVENOUS | Status: DC
  Administered 2017-11-11 (×2): via INTRAVENOUS
  Filled 2017-11-11: qty 1000

## 2017-11-11 MED ORDER — HYDROMORPHONE HCL 1 MG/ML IJ SOLN
INTRAMUSCULAR | Status: DC | PRN
  Administered 2017-11-11: 0.5 mg via INTRAVENOUS

## 2017-11-11 MED ORDER — EPHEDRINE SULFATE-NACL 50-0.9 MG/10ML-% IV SOSY
PREFILLED_SYRINGE | INTRAVENOUS | Status: DC | PRN
  Administered 2017-11-11 (×2): 5 mg via INTRAVENOUS
  Administered 2017-11-11: 10 mg via INTRAVENOUS
  Administered 2017-11-11: 5 mg via INTRAVENOUS

## 2017-11-11 SURGICAL SUPPLY — 30 items
BAG URINE DRAINAGE (UROLOGICAL SUPPLIES) ×3 IMPLANT
BLADE CLIPPER SURG (BLADE) ×3 IMPLANT
CATH FOLEY 2WAY SLVR  5CC 16FR (CATHETERS) ×2
CATH FOLEY 2WAY SLVR 5CC 16FR (CATHETERS) ×1 IMPLANT
CATH ROBINSON RED A/P 16FR (CATHETERS) IMPLANT
CATH ROBINSON RED A/P 20FR (CATHETERS) ×3 IMPLANT
CLOTH BEACON ORANGE TIMEOUT ST (SAFETY) ×3 IMPLANT
COVER BACK TABLE 60X90IN (DRAPES) ×3 IMPLANT
COVER MAYO STAND STRL (DRAPES) ×3 IMPLANT
DRSG TEGADERM 4X4.75 (GAUZE/BANDAGES/DRESSINGS) ×3 IMPLANT
DRSG TEGADERM 8X12 (GAUZE/BANDAGES/DRESSINGS) ×6 IMPLANT
GAUZE SPONGE 4X4 12PLY STRL (GAUZE/BANDAGES/DRESSINGS) ×3 IMPLANT
GLOVE ECLIPSE 8.0 STRL XLNG CF (GLOVE) ×3 IMPLANT
GLOVE SURG SS PI 8.0 STRL IVOR (GLOVE) ×6 IMPLANT
GOWN STRL REUS W/ TWL XL LVL3 (GOWN DISPOSABLE) ×1 IMPLANT
GOWN STRL REUS W/TWL XL LVL3 (GOWN DISPOSABLE) ×5 IMPLANT
HOLDER FOLEY CATH W/STRAP (MISCELLANEOUS) ×3 IMPLANT
IMPL SPACEOAR SYSTEM 10ML (MISCELLANEOUS) ×1 IMPLANT
IMPLANT SPACEOAR SYSTEM 10ML (MISCELLANEOUS) ×3
IV NS 1000ML (IV SOLUTION) ×2
IV NS 1000ML BAXH (IV SOLUTION) ×1 IMPLANT
KIT TURNOVER CYSTO (KITS) ×3 IMPLANT
PACK CYSTO (CUSTOM PROCEDURE TRAY) ×3 IMPLANT
SELECTSEED I-125 ×3 IMPLANT
SURGILUBE 2OZ TUBE FLIPTOP (MISCELLANEOUS) ×3 IMPLANT
SUT BONE WAX W31G (SUTURE) ×3 IMPLANT
SYRINGE 10CC LL (SYRINGE) IMPLANT
UNDERPAD 30X30 (UNDERPADS AND DIAPERS) ×6 IMPLANT
WATER STERILE IRR 3000ML UROMA (IV SOLUTION) ×3 IMPLANT
WATER STERILE IRR 500ML POUR (IV SOLUTION) ×3 IMPLANT

## 2017-11-11 NOTE — Anesthesia Procedure Notes (Signed)
Procedure Name: Intubation Date/Time: 11/11/2017 7:58 AM Performed by: Gwyndolyn Saxon, CRNA Pre-anesthesia Checklist: Patient identified, Emergency Drugs available, Suction available, Patient being monitored and Timeout performed Patient Re-evaluated:Patient Re-evaluated prior to induction Oxygen Delivery Method: Circle system utilized Preoxygenation: Pre-oxygenation with 100% oxygen Induction Type: IV induction Ventilation: Mask ventilation without difficulty and Oral airway inserted - appropriate to patient size Laryngoscope Size: Sabra Heck and 2 Grade View: Grade I Tube type: Oral Tube size: 8.0 mm Number of attempts: 1 Placement Confirmation: ETT inserted through vocal cords under direct vision,  positive ETCO2,  CO2 detector and breath sounds checked- equal and bilateral Secured at: 22 cm Tube secured with: Tape Dental Injury: Teeth and Oropharynx as per pre-operative assessment

## 2017-11-11 NOTE — Anesthesia Postprocedure Evaluation (Signed)
Anesthesia Post Note  Patient: Donald Pollard  Procedure(s) Performed: RADIOACTIVE SEED IMPLANT/BRACHYTHERAPY IMPLANT (N/A ) SPACE OAR INSTILLATION (N/A )     Patient location during evaluation: PACU Anesthesia Type: General Level of consciousness: awake and alert Pain management: pain level controlled Vital Signs Assessment: post-procedure vital signs reviewed and stable Respiratory status: spontaneous breathing, nonlabored ventilation, respiratory function stable and patient connected to nasal cannula oxygen Cardiovascular status: blood pressure returned to baseline and stable Postop Assessment: no apparent nausea or vomiting Anesthetic complications: no    Last Vitals:  Vitals:   11/11/17 0930 11/11/17 0938  BP: 127/66 (!) (P) 145/93  Pulse: (!) 39   Resp: 13   Temp:    SpO2: (!) 85% (P) 98%    Last Pain:  Vitals:   11/11/17 0535  TempSrc: Oral                 Kazi Montoro

## 2017-11-11 NOTE — Transfer of Care (Signed)
Immediate Anesthesia Transfer of Care Note  Patient: Donald Pollard  Procedure(s) Performed: RADIOACTIVE SEED IMPLANT/BRACHYTHERAPY IMPLANT (N/A ) SPACE OAR INSTILLATION (N/A )  Patient Location: PACU  Anesthesia Type:General  Level of Consciousness: sedated  Airway & Oxygen Therapy: Patient Spontanous Breathing and Patient connected to nasal cannula oxygen  Post-op Assessment: Report given to RN and Post -op Vital signs reviewed and stable  Post vital signs: Reviewed and stable  Last Vitals:  Vitals Value Taken Time  BP 127/66 11/11/2017  9:30 AM  Temp    Pulse 85 11/11/2017  9:41 AM  Resp 13 11/11/2017  9:41 AM  SpO2 97 % 11/11/2017  9:41 AM  Vitals shown include unvalidated device data.  Last Pain:  Vitals:   11/11/17 0535  TempSrc: Oral      Patients Stated Pain Goal: 7 (41/58/30 [id number])  Complications: No apparent anesthesia complications

## 2017-11-11 NOTE — Op Note (Signed)
PATIENT:  [name]  PRE-OPERATIVE DIAGNOSIS:  Adenocarcinoma of the prostate  POST-OPERATIVE DIAGNOSIS:  Same  PROCEDURE:  Procedure(s): 1. I-125 radioactive seed implantation 2. Cystoscopy  SURGEON:  Surgeon(s): Irine Seal MD  Radiation oncologist: Dr. Tyler Pita  ANESTHESIA:  General  EBL:  Minimal  DRAINS: 55 French Foley catheter  INDICATION: Donald Pollard is a 67 y.o. with Stage T2a Nx Mx, Gleason 7(3+4) prostate cancer who has elected brachytherapy for treatment.  Description of procedure: After informed consent the patient was brought to the major OR, placed on the table and administered general anesthesia. He was then moved to the modified lithotomy position with his perineum perpendicular to the floor. His perineum and genitalia were then sterilely prepped. An official timeout was then performed. A 16 French Foley catheter was then placed in the bladder and filled with dilute contrast, a rectal tube was placed in the rectum and the transrectal ultrasound probe was placed in the rectum and affixed to the stand. He was then sterilely draped.  The sterile grid was installed.   Anchor needles were then placed.   Real time ultrasonography was used along with the seed planning software spot-pro version 3.1-00. This was used to develop the seed plan including the number of needles as well as number of seeds required for complete and adequate coverage. Real-time ultrasonography was then used along with the previously developed plan and the Nucletron device to implant a total of 87 seeds using 24 needles for a target dose of 145 Gy. This proceeded without difficulty or complication.  The US guide was removed and the SpaceOAR needle was advanced in the midline to post prostatic fat plain and the position was confirmed with saline injection.  The SpaceOAR polymer was then injected with good distribution and the needle was removed.   A Foley catheter was then removed as well as  the transrectal ultrasound probe and rectal probe. Flexible cystoscopy was then performed using the 17 French flexible scope which revealed a normal urethra throughout its length down to the sphincter which appeared intact. 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 The bladder was then entered and fully and systematically inspected.  The ureteral orifices were noted to be of normal configuration and position. The mucosa revealed no evidence of tumors. There were also no stones identified within the bladder.  No seeds or spacers were seen and/or removed from the bladder.  The cystoscope was then removed.  The drapes were removed.  The perineum was cleaned and dressed.  He was taken out of the lithotomy position and was awakened and taken to recovery room in stable and satisfactory condition. He tolerated procedure well and there were no intraoperative complications.

## 2017-11-11 NOTE — H&P (Signed)
CC/HPI: Pt presents today for pre-operative history and physical exam in anticipation of radioactive seed implant and space oar by Dr. Jeffie Pollock on 11/11/17. He is doing well and is without complaint. Pt denies F/C, HA, CP, SOB, N/V, diarrhea, back pain, flank pain, hematuria, and dysuria. He does have occasional constipation which is treated by his PCP.    HX:     CC/HPI: I have prostate cancer.     Mr. Longest return today in f/u from his MR fusion biopsy. He was found to have T2a Nx Mx Gleason 7(3+4) prostate cancer in 10% of the right base medial biopsy. The ROI biopsies were negative. His recent IPSS was only 5. The [facility] nomogram predicts 56% chance of OCD, 43% ECE, 2% LNI and 1% SVI. His prostate volume was 20ml. He his PSA was 10.9. He had a prior biopsy in 1/16 for a PSA of 8.1. that biopsy was negative.     ALLERGIES: No Allergies    MEDICATIONS: Aspirin Ec 81 mg tablet, delayed release Oral  Hydrochlorothiazide 25 mg tablet Oral  Lisinopril 10 mg tablet Oral  Lovastatin 20 mg tablet Oral  Saw Palmetto     GU PSH: Prostate Needle Biopsy - 06/24/2017, 12/20/2015      PSH Notes: Needle Biopsy Of Prostate, No Surgical Problems  multiple tooth extractions     NON-GU PSH: Surgical Pathology, Gross And Microscopic Examination For Prostate Needle - 06/24/2017    GU PMH: Prostate Cancer, He has T2a Nx Mx gleason 7(3+4) low volume disease. I discussed the options and he is interested in the seed implant. I will get him set up to see Dr. Ledon Snare for further consideration. 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 I have reviewed the risks of bleeding, infection and voiding difficulty and have sent valium and levaquin. , - 06/04/2017 (Worsening), He has a rising PSA with prostate irregularity and a prior negative biopsy. He needs a prostate MRI to determine whether he needs another biopsy. , - 03/26/2017,  PSA elevation, - 12/20/2015 Nocturia - 03/26/2017 Prostate nodule w/ LUTS (Stable) - 03/26/2017 Prostate nodule w/o LUTS, Nodular prostate without lower urinary tract symptoms - 12/20/2015 Spermatocele of epididymis, Unspec, Spermatocele - 12/20/2015 Dysuria, Dysuria - 2016 Gross hematuria, Gross hematuria - 2016    NON-GU PMH: Personal history of other diseases of the circulatory system, History of hypertension - 2015 Personal history of other endocrine, nutritional and metabolic disease, History of hyperlipidemia - 2015 Encounter for general adult medical examination without abnormal findings, Encounter for preventive health examination    FAMILY HISTORY: Death of family member - Runs In Family Prostate Cancer - Runs In Family   SOCIAL HISTORY: Marital Status: Single Preferred Language: English; Ethnicity: Not Hispanic Or Latino; Race: Black or African American Current Smoking Status: Patient smokes. Has smoked since 10/16/1967. Smokes 2 packs per day.   Tobacco Use Assessment Completed: Used Tobacco in last 30 days? Smoking cessation counseling was provided.     Notes: Alcohol use, Tobacco use, Retired, Divorced   REVIEW OF SYSTEMS:    GU Review Male:   Patient reports get up at night to urinate and stream starts and stops. Patient denies frequent urination, hard to postpone urination, burning/ pain with urination, leakage of urine, trouble starting your stream, have to strain to urinate , erection problems, and penile pain.  Gastrointestinal (Upper):   Patient reports indigestion/ heartburn. Patient denies  nausea and vomiting.  Gastrointestinal (Lower):   Patient reports constipation. Patient denies diarrhea.  Constitutional:   Patient denies fever, night sweats, weight loss, and fatigue.  Skin:   Patient denies skin rash/ lesion and itching.  Eyes:   Patient denies blurred vision and double vision.  Ears/ Nose/ Throat:   Patient denies sore throat and sinus problems.   Hematologic/Lymphatic:   Patient denies swollen glands and easy bruising.  Cardiovascular:   Patient denies leg swelling and chest pains.  Respiratory:   Patient denies cough and shortness of breath.  Endocrine:   Patient denies excessive thirst.  Musculoskeletal:   Patient denies back pain and joint pain.  Neurological:   Patient denies headaches and dizziness.  Psychologic:   Patient denies depression and anxiety.   VITAL SIGNS:      10/26/2017 01:30 PM  Weight 190 lb / 86.18 kg  Height 74 in / 187.96 cm  BP 154/85 mmHg  Pulse 84 /min  Temperature 97.8 F / 36.5 C  BMI 24.4 kg/m   MULTI-SYSTEM PHYSICAL EXAMINATION:    Constitutional: Well-nourished. No physical deformities. Normally developed. Good grooming.  Neck: Neck symmetrical, not swollen. Normal tracheal position.  Respiratory: Normal breath sounds. No labored breathing, no use of accessory muscles.   Cardiovascular: Regular rate and rhythm. No murmur, no gallop. Normal temperature, normal extremity pulses, no swelling, no varicosities.   Lymphatic: No enlargement of neck, axillae, groin.  Skin: No paleness, no jaundice, no cyanosis. No lesion, no ulcer, no rash.  Neurologic / Psychiatric: Oriented to time, oriented to place, oriented to person. No depression, no anxiety, no agitation.  Gastrointestinal: No mass, no tenderness, no rigidity, non obese abdomen.  Eyes: Normal conjunctivae. Normal eyelids.  Ears, Nose, Mouth, and Throat: Left ear no scars, no lesions, no masses. Right ear no scars, no lesions, no masses. Nose no scars, no lesions, no masses. Normal hearing. Normal lips.  Musculoskeletal: Normal gait and station of head and neck.     PAST DATA REVIEWED:  Source Of History:  Patient  Records Review:   Previous Patient Records  Urine Test Review:   Urinalysis   10/26/17  Urinalysis  Urine Appearance Clear   Urine Color Yellow   Urine Glucose Neg   Urine Bilirubin Neg   Urine Ketones Neg   Urine Specific  Gravity 1.025   Urine Blood Neg   Urine pH <=5.0   Urine Protein Neg   Urine Urobilinogen 0.2   Urine Nitrites Neg   Urine Leukocyte Esterase Neg    PROCEDURES:          Urinalysis Dipstick Dipstick Cont'd  Color: Yellow Bilirubin: Neg  Appearance: Clear Ketones: Neg  Specific Gravity: 1.025 Blood: Neg  pH: <=5.0 Protein: Neg  Glucose: Neg Urobilinogen: 0.2    Nitrites: Neg    Leukocyte Esterase: Neg    ASSESSMENT:      ICD-10 Details  1 GU:   Prostate Cancer - C61    PLAN:            Medications Stop Meds: Diazepam 10 mg tablet 1-2 tablet PO Daily Take one hour prior to procedure. Start: 06/04/2017  Discontinue: 10/26/2017  - Reason: The medication cycle was completed.            Schedule Return Visit/Planned Activity: Keep Scheduled Appointment - Schedule Surgery          Document Letter(s):  Created for Patient: Clinical Summary         Notes:  There are no changes in the patient's history or physical exam since last evaluation by Dr. Jeffie Pollock. Pt is scheduled to undergo seed implant and space oar on 11/11/17.   All questions were answered to the best of my ability.             Next Appointment:      Next Appointment: 11/11/2017 07:30 AM    Appointment Type: Surgery     Location: [facility] [phone]    Provider: Irine Seal, M.D.    Reason for Visit: [place]

## 2017-11-11 NOTE — Discharge Instructions (Addendum)
Brachytherapy for Prostate Cancer, Care After Refer to this sheet in the next few weeks. These instructions provide you with information on caring for yourself after your procedure. Your health care provider may also give you more specific instructions. Your treatment has been planned according to current medical practices, but problems sometimes occur. Call your health care provider if you have any problems or questions after your procedure. What can I expect after the procedure? The area behind the scrotum will probably be tender and bruised. For a short period of time you may have:  Difficulty passing urine. You may need a catheter for a few days to a month.  Blood in the urine or semen.  A feeling of constipation because of prostate swelling.  Frequent feeling of an urgent need to urinate.  For a long period of time you may have:  Inflammation of the rectum. This happens in about 2% of people who have the procedure.  Erection problems. These vary with age and occur in about 15-40% of men.  Difficulty urinating. This is caused by scarring in the urethra.  Diarrhea.  Follow these instructions at home:  Take medicines only as directed by your health care provider.  You will probably have a catheter in your bladder for several days. You will have blood in the urine bag and should drink a lot of fluids to keep it a light red color.  Keep all follow-up visits as directed by your health care provider. If you have a catheter, it will be removed during one of these visits.  Try not to sit directly on the area behind the scrotum. A soft cushion can decrease the discomfort. Ice packs may also be helpful for the discomfort. Do not put ice directly on the skin.  Shower and wash the area behind the scrotum gently. Do not sit in a tub.  If you have had the brachytherapy that uses the seeds, limit your close contact with children and pregnant women for 2 months because of the radiation still  in the prostate. After that period of time, the levels drop off quickly. Get help right away if:  You have a fever.  You have chills.  You have shortness of breath.  You have chest pain.  You have thick blood, like tomato juice, in the urine bag.  Your catheter is blocked so urine cannot get into the bag. Your bladder area or lower abdomen may be swollen.  There is excessive bleeding from your rectum. It is normal to have a little blood mixed with your stool.  There is severe discomfort in the treated area that does not go away with pain medicine.  You have abdominal discomfort.  You have severe nausea or vomiting.  You develop any new or unusual symptoms.  I have given you a script for tamsulosin to take 1 daily 80min after a meal.   This medicine will help you pass your urine.   It may cause some nasal congestion and rarely lightheadedness or dizziness when you stand rapidly.  This information is not intended to replace advice given to you by your health care provider. Make sure you discuss any questions you have with your health care provider. Document Released: 09/05/2010 Document Revised: 01/15/2016 Document Reviewed: 01/24/2013 Elsevier Interactive Patient Education  2017 [facility] Instructions  Activity: Get plenty of rest for the remainder of the day. A responsible individual must stay with you for 24 hours following the  procedure.  For the next 24 hours, DO NOT: -Drive a car -Paediatric nurse -Drink alcoholic beverages -Take any medication unless instructed by your physician -Make any legal decisions or sign important papers.  Meals: Start with liquid foods such as gelatin or soup. Progress to regular foods as tolerated. Avoid greasy, spicy, heavy foods. If nausea and/or vomiting occur, drink only clear liquids until the nausea and/or vomiting subsides. Call your physician if vomiting continues.  Special  Instructions/Symptoms: Your throat may feel dry or sore from the anesthesia or the breathing tube placed in your throat during surgery. If this causes discomfort, gargle with warm salt water. The discomfort should disappear within 24 hours.   I

## 2017-11-12 ENCOUNTER — Encounter (HOSPITAL_BASED_OUTPATIENT_CLINIC_OR_DEPARTMENT_OTHER): Payer: Self-pay | Admitting: Urology

## 2017-11-14 NOTE — Progress Notes (Signed)
  Radiation Oncology         [phone] ________________________________  Name: Donald Pollard MRN: [id number]  Date: 11/14/2017  DOB: June 18, 1951       Prostate Seed Implant  CC:No primary care provider on file.  No ref. provider found  DIAGNOSIS: 67 yo gentleman with stage T2a adenocarcinoma of the prostate with a Gleason's score of 3+4 and a PSA of 10.9    ICD-10-CM   1. Malignant neoplasm of prostate ([place]) C61     PROCEDURE: Insertion of radioactive I-125 seeds into the prostate gland.  RADIATION DOSE: 145 Gy, definitive therapy.  TECHNIQUE: Donald Pollard was brought to the operating room with the urologist. He was placed in the dorsolithotomy position. He was catheterized and a rectal tube was inserted. The perineum was shaved, prepped and draped. The ultrasound probe was then introduced into the rectum to see the prostate gland.  TREATMENT DEVICE: A needle grid was attached to the ultrasound probe stand and anchor needles were placed.  3D PLANNING: The prostate was imaged in 3D using a sagittal sweep of the prostate probe. These images were transferred to the planning computer. There, the prostate, urethra and rectum were defined on each axial reconstructed image. Then, the software created an optimized 3D plan and a few seed positions were adjusted. The quality of the plan was reviewed using [facility] information for the target and the following two organs at risk:  Urethra and Rectum.  Then the accepted plan was uploaded to the seed Selectron afterloading unit.  PROSTATE VOLUME STUDY:  Using transrectal ultrasound the volume of the prostate was verified to be 83.5 cc.  SPECIAL TREATMENT PROCEDURE/SUPERVISION AND HANDLING: The Nucletron FIRST system was used to place the needles under sagittal guidance. A total of 24 needles were used to deposit 87 seeds in the prostate gland. The individual seed activity was 0.593 mCi.  SpaceOAR:  Yes  COMPLEX SIMULATION: At the end of the  procedure, an anterior radiograph of the pelvis was obtained to document seed positioning and count. Cystoscopy was performed to check the urethra and bladder.  MICRODOSIMETRY: At the end of the procedure, the patient was emitting 0.06 mR/hr at 1 meter. Accordingly, he was considered safe for hospital discharge.  PLAN: The patient will return to the radiation oncology clinic for post implant CT dosimetry in three weeks.   ________________________________  Sheral Apley Tammi Klippel, M.D.

## 2017-11-24 ENCOUNTER — Telehealth: Payer: Self-pay | Admitting: *Deleted

## 2017-11-24 NOTE — Telephone Encounter (Signed)
CALLED PATIENT TO REMIND OF POST SEED APPTS. FOR 11-25-17 AND HIS MRI ON 11-27-17 @ [facility], SPOKE WITH PATIENT'S DAUGHTER CAMESHA, AND SHE IS AWARE OF THESE APPTS.

## 2017-11-25 ENCOUNTER — Encounter: Payer: Self-pay | Admitting: Radiation Oncology

## 2017-11-25 ENCOUNTER — Other Ambulatory Visit: Payer: Self-pay

## 2017-11-25 ENCOUNTER — Ambulatory Visit
Admission: RE | Admit: 2017-11-25 | Discharge: 2017-11-25 | Disposition: A | Discharge status: Home / Self Care | Payer: Medicare Other | Source: Ambulatory Visit | Attending: Radiation Oncology | Admitting: Radiation Oncology

## 2017-11-25 DIAGNOSIS — C61 Malignant neoplasm of prostate: Secondary | ICD-10-CM | POA: Insufficient documentation

## 2017-11-25 DIAGNOSIS — Z923 Personal history of irradiation: Secondary | ICD-10-CM | POA: Insufficient documentation

## 2017-11-25 DIAGNOSIS — Z510 Encounter for antineoplastic radiation therapy: Secondary | ICD-10-CM | POA: Diagnosis not present

## 2017-11-25 DIAGNOSIS — Z7982 Long term (current) use of aspirin: Secondary | ICD-10-CM | POA: Insufficient documentation

## 2017-11-25 DIAGNOSIS — Z79899 Other long term (current) drug therapy: Secondary | ICD-10-CM | POA: Insufficient documentation

## 2017-11-25 NOTE — Progress Notes (Signed)
  Radiation Oncology         [phone]) [phone] ________________________________  Name: Donald Pollard MRN: [id number]  Date: 11/25/2017  DOB: 06/27/1951  COMPLEX SIMULATION NOTE  NARRATIVE:  The patient was brought to the [facility] today following prostate seed implantation approximately one month ago.  Identity was confirmed.  All relevant records and images related to the planned course of therapy were reviewed.  Then, the patient was set-up supine.  CT images were obtained.  The CT images were loaded into the planning software.  Then the prostate and rectum were contoured.  Treatment planning then occurred.  The implanted iodine 125 seeds were identified by the physics staff for projection of radiation distribution  I have requested : 3D Simulation  I have requested a DVH of the following structures: Prostate and rectum.    ________________________________  Sheral Apley Tammi Klippel, M.D.  This document serves as a record of services personally performed by Tyler Pita, MD. It was created on his behalf by Rae Lips, a trained medical scribe. The creation of this record is based on the scribe's personal observations and the provider's statements to them. This document has been checked and approved by the attending provider.

## 2017-11-25 NOTE — Progress Notes (Signed)
Weight and vital stable. Denies pain. Pre seed IPSS 10. Post seed IPSS 10. Scheduled for MRI to confirm SpaceOar placement on Saturday. Scheduled to follow up with Jiles Crocker at [facility] on 4/18. Reports he continues to take Flomax but, wonders how much longer he will have to do so. Denies dysuria, leakage or incontinence. Reports scant hematuria.   BP 135/71   Pulse 87   Temp 98.3 F (36.8 C) (Oral)   Resp 20   Wt 199 lb (90.3 kg)   SpO2 100%   BMI 25.55 kg/m  Wt Readings from Last 3 Encounters:  11/25/17 199 lb (90.3 kg)  11/11/17 194 lb 4 oz (88.1 kg)  08/18/17 193 lb 12.8 oz (87.9 kg)

## 2017-11-25 NOTE — Progress Notes (Signed)
Radiation Oncology         [phone] ________________________________  Name: Donald Pollard MRN: [id number]  Date: 11/25/2017  DOB: 06/30/1951  Post-Seed Follow-Up Visit Note  CC: Erma Heritage, PA-C  Avelino Leeds*  Diagnosis:   67 y.o. 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 Malignant neoplasm of prostate (HCC) C61     Interval Since Last Radiation:  2 weeks - Insertion of radioactive I-125 seeds into the prostate gland, 145 Gy definitive therapy, with SpaceOAR  Narrative:  The patient returns today for routine follow-up.  He is complaining of increased urinary frequency and urinary hesitation symptoms. He filled out a questionnaire regarding urinary function today providing an overall IPSS score of 10 characterizing his symptoms as moderate. He continues to take Flomax for his urinary symptoms. He also reports scant hematuria. He denies any dysuria, leakage or incontinence.  His pre-implant score was 10. He denies any bowel symptoms. He is scheduled for MRI to confirm [place] placement on 11/27/2017.  ALLERGIES:  has No Known Allergies.  Meds: Current Outpatient Medications  Medication Sig Dispense Refill  . aspirin 81 MG tablet Take 81 mg by mouth daily.    . hydrochlorothiazide (HYDRODIURIL) 25 MG tablet Take 25 mg by mouth daily.    Marland Kitchen HYDROcodone-acetaminophen (NORCO) 5-325 MG tablet Take 1 tablet by mouth every 6 (six) hours as needed for moderate pain. 8 tablet 0  . lisinopril (PRINIVIL,ZESTRIL) 10 MG tablet Take 10 mg by mouth daily.    Marland Kitchen lovastatin (MEVACOR) 20 MG tablet Take 20 mg by mouth daily at 6 PM.     . saw palmetto 160 MG capsule Take 160 mg by mouth 2 (two) times daily.    . tamsulosin (FLOMAX) 0.4 MG CAPS capsule Take 1 capsule (0.4 mg total) by mouth daily. 30 capsule 1  . UNABLE TO FIND Stool softener 1 per day     No current facility-administered medications for  this encounter.     Physical Findings: In general this is a well appearing African-American male in no acute distress. He's alert and oriented x4 and appropriate throughout the examination. Cardiopulmonary assessment is negative for acute distress and he exhibits normal effort.   Lab Findings: Lab Results  Component Value Date   WBC 10.6 (H) 11/04/2017   HGB 12.9 (L) 11/04/2017   HCT 39.5 11/04/2017   MCV 92.3 11/04/2017   PLT 286 11/04/2017    Radiographic Findings:  Patient underwent CT imaging in our clinic for post implant dosimetry. The CT was reviewed by Dr. Tammi Klippel and appears to demonstrate an adequate distribution of radioactive seeds throughout the prostate gland. There are no seeds in or near the rectum. We suspect the final radiation plan and dosimetry will show appropriate coverage of the prostate gland.   Impression/Plan: The patient is recovering from the effects of radiation. His urinary symptoms should gradually improve over the next 4-6 months. We talked about this today. He is encouraged by his improvement already and is otherwise pleased with his outcome. We also talked about long-term follow-up for prostate cancer following seed implant. He understands that ongoing PSA determinations and digital rectal exams will help perform surveillance to rule out disease recurrence. He has a follow up appointment scheduled with Jiles Crocker, NP at [facility] on 12/02/2017. He understands what to expect with his PSA measures. Patient was also educated  today about some of the long-term effects from radiation including a small risk for rectal bleeding and possibly erectile dysfunction. We talked about some of the general management approaches to these potential complications. However, I did encourage the patient to contact our office or return at any point if he has questions or concerns related to his previous radiation and prostate cancer.    Nicholos Johns, PA-C    Tyler Pita, MD  [facility] Direct Dial: [phone]  Fax: [phone] [email or web address]  Skype  LinkedIn   his document serves as a record of services personally performed by Tyler Pita, MD and Freeman Caldron, PA-C. It was created on their behalf by Rae Lips, a trained medical scribe. The creation of this record is based on the scribe's personal observations and the providers' statements to them. This document has been checked and approved by the attending providers.

## 2017-11-27 ENCOUNTER — Ambulatory Visit (HOSPITAL_COMMUNITY)
Admission: RE | Admit: 2017-11-27 | Discharge: 2017-11-27 | Disposition: A | Discharge status: Home / Self Care | Payer: Medicare Other | Source: Ambulatory Visit | Attending: Urology | Admitting: Urology

## 2017-11-27 DIAGNOSIS — C61 Malignant neoplasm of prostate: Secondary | ICD-10-CM | POA: Diagnosis not present

## 2017-12-27 ENCOUNTER — Ambulatory Visit: Payer: Medicare Other | Attending: Radiation Oncology | Admitting: Radiation Oncology

## 2017-12-27 ENCOUNTER — Encounter: Payer: Self-pay | Admitting: Radiation Oncology

## 2017-12-27 DIAGNOSIS — C61 Malignant neoplasm of prostate: Secondary | ICD-10-CM | POA: Insufficient documentation

## 2017-12-27 DIAGNOSIS — Z510 Encounter for antineoplastic radiation therapy: Secondary | ICD-10-CM | POA: Insufficient documentation

## 2018-01-02 NOTE — Progress Notes (Signed)
  Radiation Oncology         [phone] ________________________________  Name: Anthony Tamburo MRN: [id number]  Date: 12/27/2017  DOB: [birth year]  3D Planning Note   Prostate Brachytherapy Post-Implant Dosimetry  Diagnosis: 67 y.o. male with stage T2a adenocarcinoma of the prostate with a Gleason's score of 3+4 and a PSA of 10.9   Narrative: On a previous date, Saylor Sheckler returned following prostate seed implantation for post implant planning. He underwent CT scan complex simulation to delineate the three-dimensional structures of the pelvis and demonstrate the radiation distribution.  Since that time, the seed localization, and complex isodose planning with dose volume histograms have now been completed.  Results:   Prostate Coverage - The dose of radiation delivered to the 90% or more of the prostate gland (D90) was 115.31% of the prescription dose. This exceeds our goal of greater than 90%. Rectal Sparing - The volume of rectal tissue receiving the prescription dose or higher was 0.0 cc. This falls under our thresholds tolerance of 1.0 cc.  Impression: The prostate seed implant appears to show adequate target coverage and appropriate rectal sparing.  Plan:  The patient will continue to follow with urology for ongoing PSA determinations. I would anticipate a high likelihood for local tumor control with minimal risk for rectal morbidity.  ________________________________  Sheral Apley Tammi Klippel, M.D.

## 2018-03-04 ENCOUNTER — Ambulatory Visit: Payer: Medicare Other | Admitting: Urology

## 2018-03-04 DIAGNOSIS — N403 Nodular prostate with lower urinary tract symptoms: Secondary | ICD-10-CM

## 2018-03-04 DIAGNOSIS — R310 Gross hematuria: Secondary | ICD-10-CM | POA: Diagnosis not present

## 2018-03-04 DIAGNOSIS — R3912 Poor urinary stream: Secondary | ICD-10-CM

## 2018-03-04 DIAGNOSIS — Z8546 Personal history of malignant neoplasm of prostate: Secondary | ICD-10-CM | POA: Diagnosis not present

## 2018-05-17 IMAGING — CR DG CHEST 2V
2 series · 2 of 2 positions shown · non-contrast
Comparison: None.

CLINICAL DATA: Preoperative chest radiograph prior to prostate seed
implant. Smoking history.

EXAM:
CHEST  2 VIEW

[w chest pa]
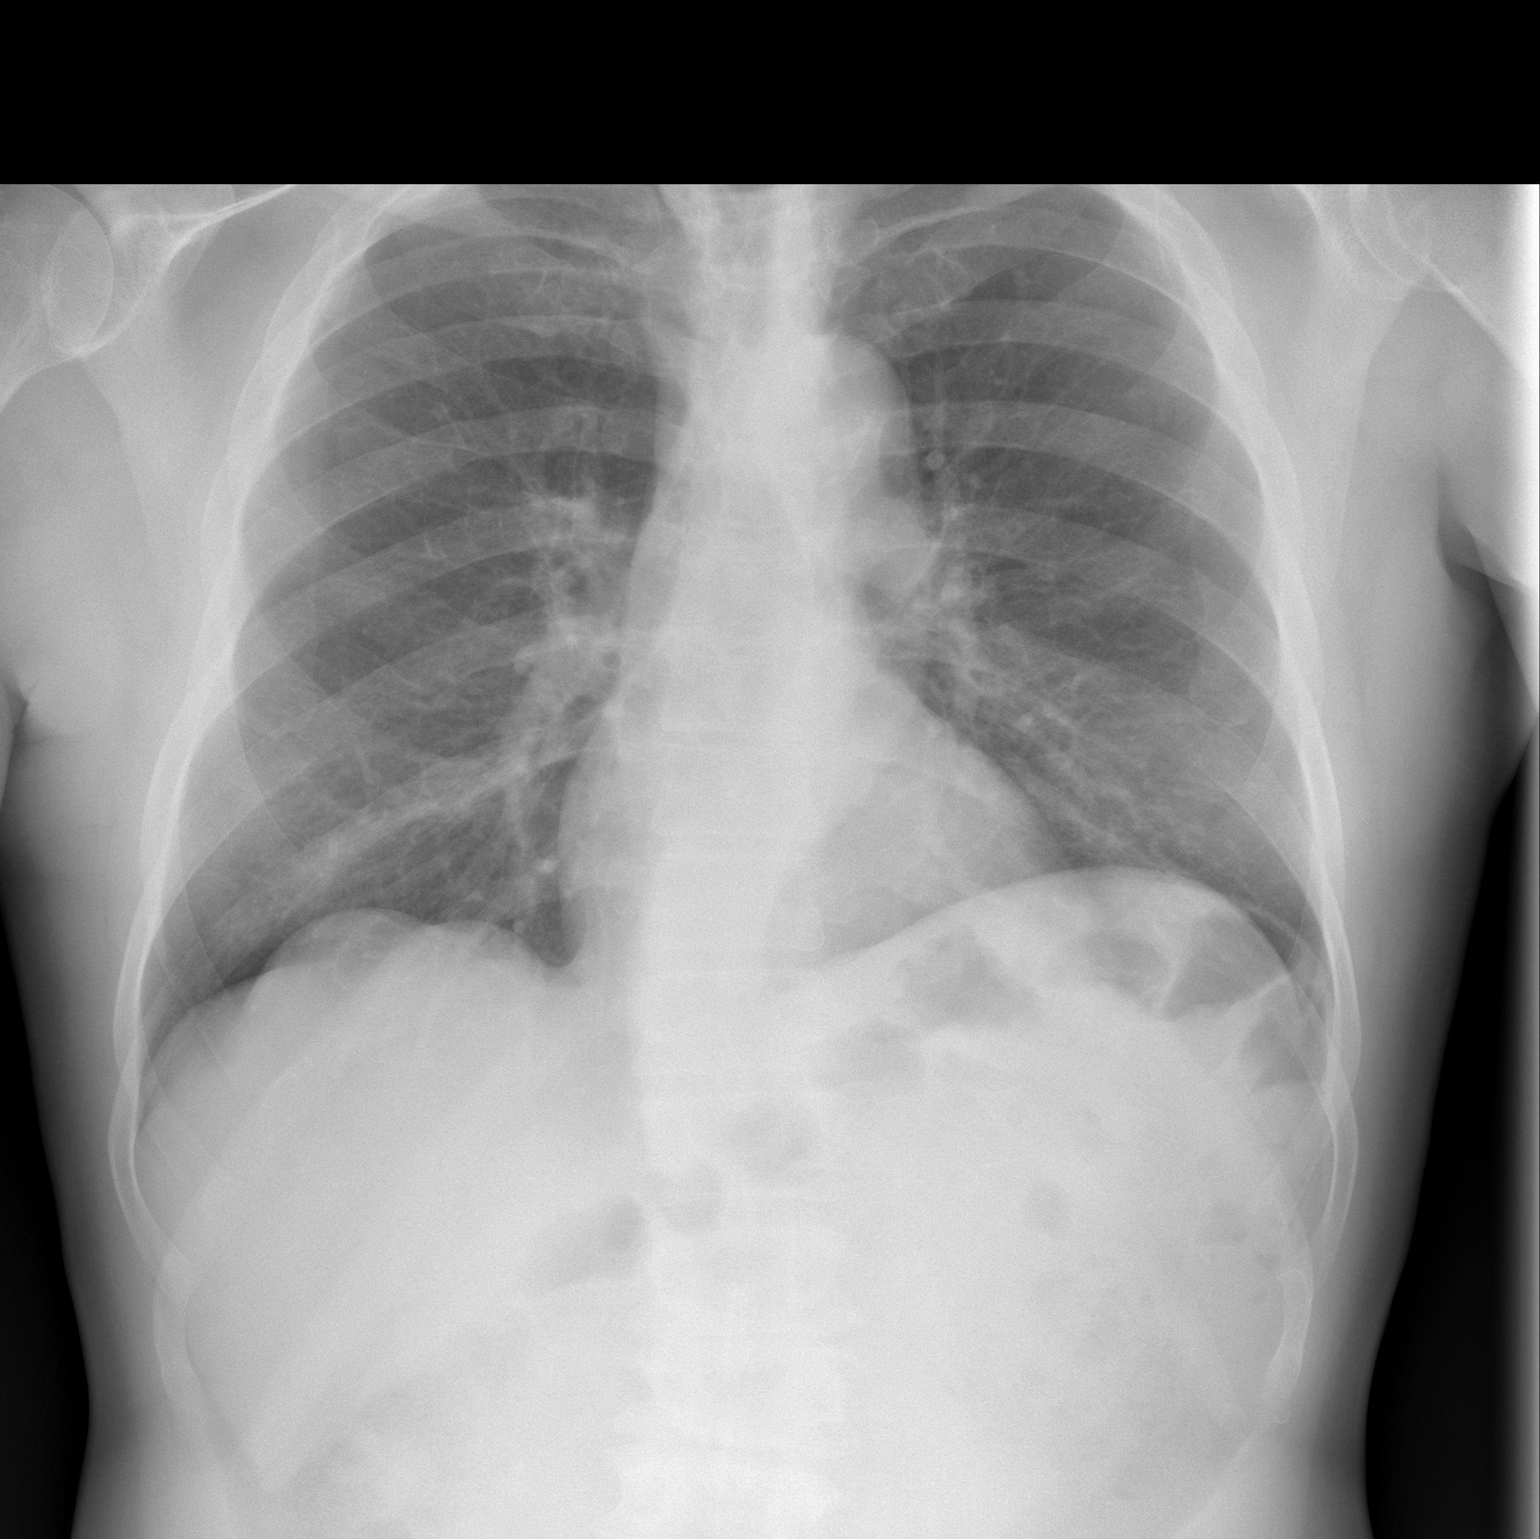

[w chest lat]
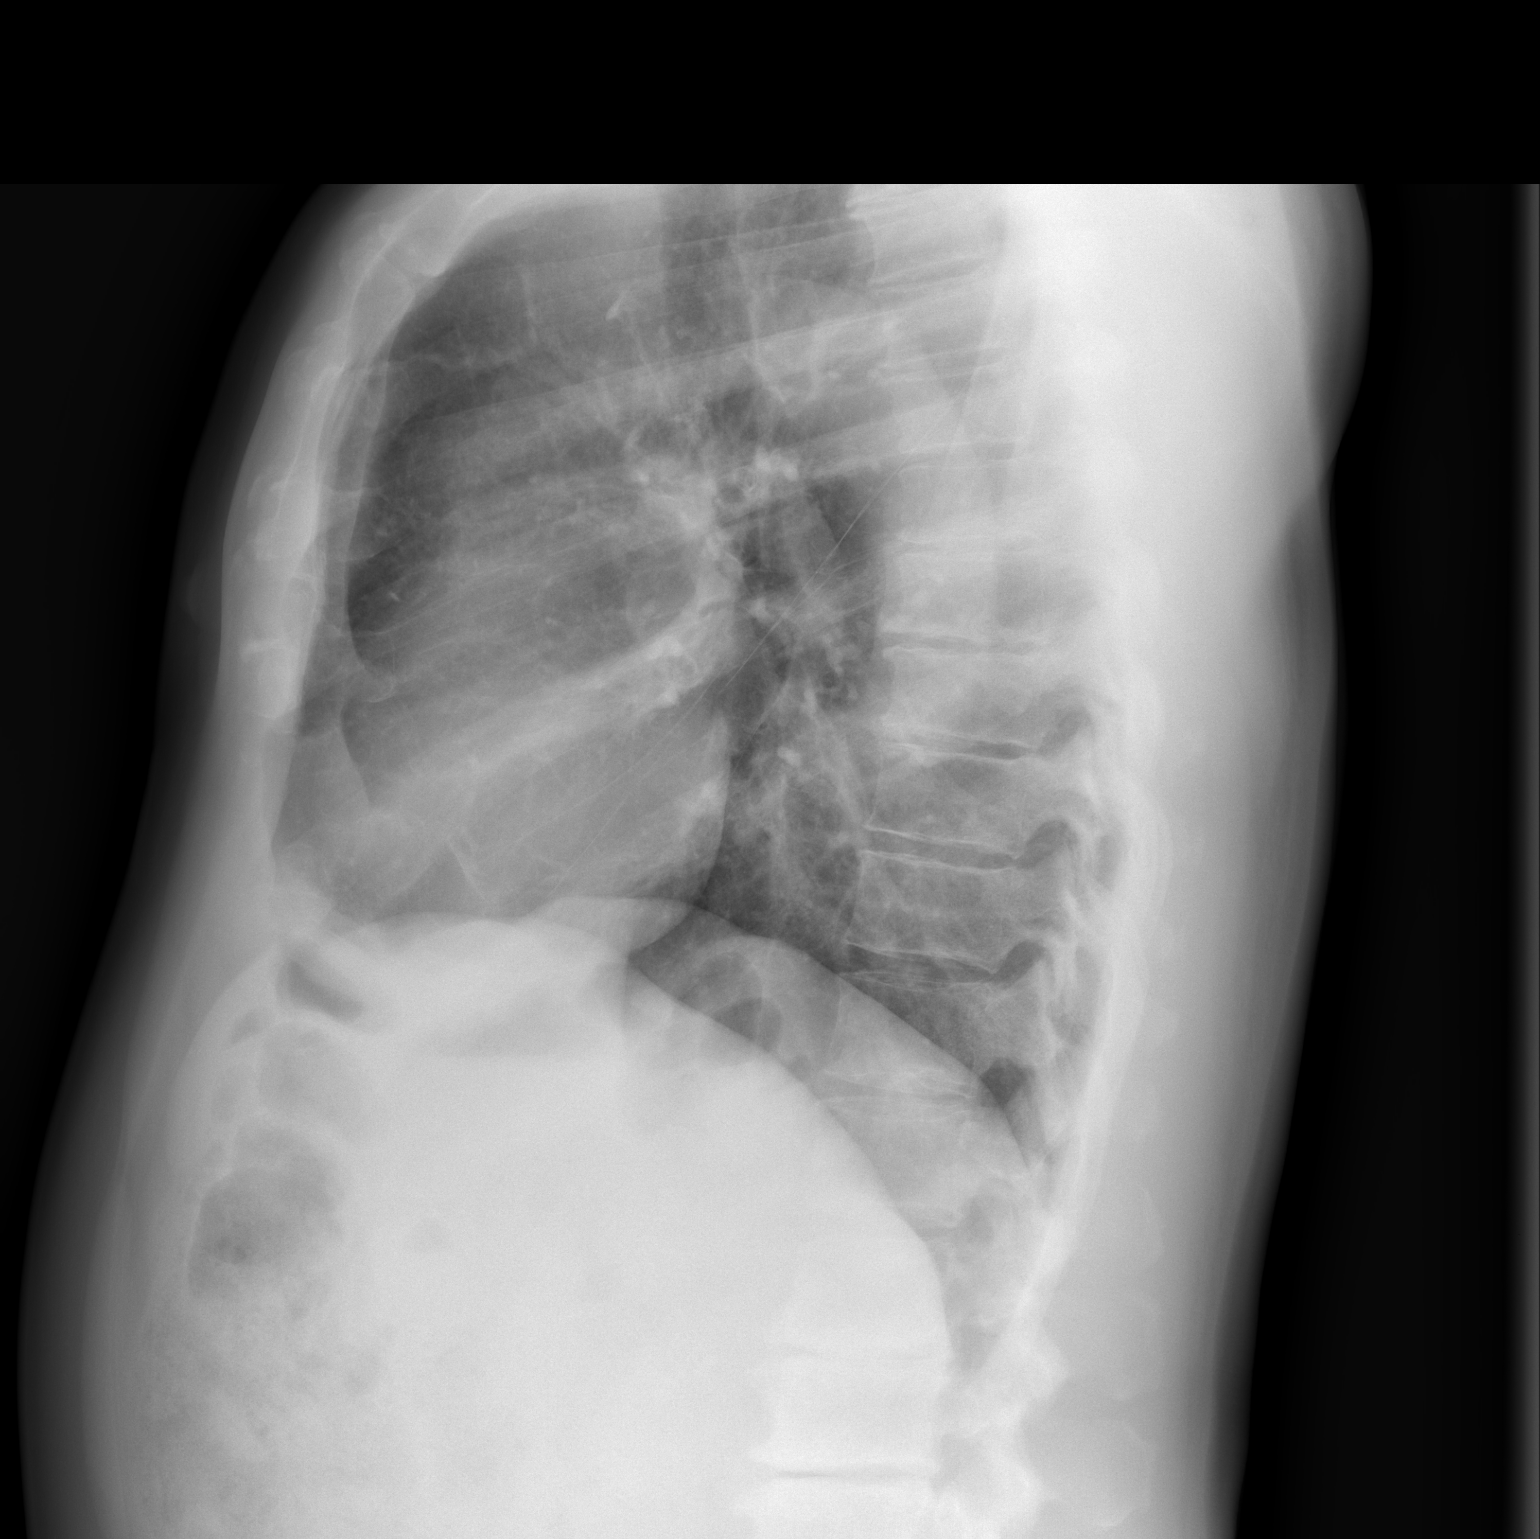

[2 of 2 positions shown; findings below may reference images not displayed]

FINDINGS: The cardiomediastinal silhouette is unremarkable.

There is no evidence of focal airspace disease, pulmonary edema,
suspicious pulmonary nodule/mass, pleural effusion, or pneumothorax.

No acute bony abnormalities are identified.
IMPRESSION: No active cardiopulmonary disease.

## 2018-08-26 ENCOUNTER — Ambulatory Visit: Payer: Medicare Other | Admitting: Urology

## 2018-08-26 DIAGNOSIS — R351 Nocturia: Secondary | ICD-10-CM

## 2018-08-26 DIAGNOSIS — N403 Nodular prostate with lower urinary tract symptoms: Secondary | ICD-10-CM

## 2018-08-26 DIAGNOSIS — Z8546 Personal history of malignant neoplasm of prostate: Secondary | ICD-10-CM

## 2018-11-13 ENCOUNTER — Emergency Department (HOSPITAL_COMMUNITY)
Admission: EM | Admit: 2018-11-13 | Discharge: 2018-11-13 | Disposition: A | Discharge status: Home / Self Care | Payer: Medicare Other | Attending: Emergency Medicine | Admitting: Emergency Medicine

## 2018-11-13 ENCOUNTER — Other Ambulatory Visit: Payer: Self-pay

## 2018-11-13 ENCOUNTER — Emergency Department (HOSPITAL_COMMUNITY): Payer: Medicare Other

## 2018-11-13 ENCOUNTER — Encounter (HOSPITAL_COMMUNITY): Payer: Self-pay | Admitting: Emergency Medicine

## 2018-11-13 DIAGNOSIS — R2232 Localized swelling, mass and lump, left upper limb: Secondary | ICD-10-CM | POA: Diagnosis present

## 2018-11-13 DIAGNOSIS — F17210 Nicotine dependence, cigarettes, uncomplicated: Secondary | ICD-10-CM | POA: Diagnosis not present

## 2018-11-13 DIAGNOSIS — Z7982 Long term (current) use of aspirin: Secondary | ICD-10-CM | POA: Diagnosis not present

## 2018-11-13 DIAGNOSIS — M109 Gout, unspecified: Secondary | ICD-10-CM | POA: Insufficient documentation

## 2018-11-13 HISTORY — DX: Gout, unspecified: M10.9

## 2018-11-13 MED ORDER — PREDNISONE 50 MG PO TABS
60.0000 mg | ORAL_TABLET | Freq: Once | ORAL | Status: AC
  Administered 2018-11-13: 60 mg via ORAL
  Filled 2018-11-13: qty 1

## 2018-11-13 MED ORDER — PREDNISONE 20 MG PO TABS: 20.0000 mg | ORAL_TABLET | Freq: Two times a day (BID) | ORAL | 0 refills | Status: AC

## 2018-11-13 NOTE — ED Notes (Signed)
ED Provider at bedside. 

## 2018-11-13 NOTE — ED Triage Notes (Addendum)
Patient c/o swelling to left hand that started 2 days ago and progressively getting worse. Patient denies any injury to hand. Redness to hand. Temp to hand WNL. Per patient hx of gout.  Denies any fever.

## 2018-11-13 NOTE — ED Notes (Signed)
Patient transported to X-ray 

## 2018-11-13 NOTE — ED Provider Notes (Signed)
[facility] EMERGENCY DEPARTMENT Provider Note   CSN: [id number] Arrival date & time: 11/13/18  1011    History   Chief Complaint Chief Complaint  Patient presents with  . Arm Swelling    HPI Donald Pollard is a 68 y.o. male.     HPI   He reports onset of pain and swelling in his left hand without trauma, 2 days ago.  History of gout but never had it in his upper extremities.  He denies fever, chills, cough, shortness of breath, weakness or dizziness.  He occasionally takes an unknown type of gout pill, but cannot remember what it is.  He tried taking in the last couple of days but it has not helped yet.  There are no other known modifying factors.  Past Medical History:  Diagnosis Date  . Arthritis   . GERD (gastroesophageal reflux disease)   . Gout   . Hyperlipidemia   . Hypertension   . Prostate cancer [facility])     Patient Active Problem List   Diagnosis Date Noted  . Malignant neoplasm of prostate ([place]) 08/18/2017    Past Surgical History:  Procedure Laterality Date  . colonscopy    . PROSTATE BIOPSY    . RADIOACTIVE SEED IMPLANT N/A 11/11/2017   Procedure: RADIOACTIVE SEED IMPLANT/BRACHYTHERAPY IMPLANT;  Surgeon: Irine Seal, MD;  Location: [facility];  Service: Urology;  Laterality: N/A;  . SPACE OAR INSTILLATION N/A 11/11/2017   Procedure: SPACE OAR INSTILLATION;  Surgeon: Irine Seal, MD;  Location: [facility]  Service: Urology;  Laterality: N/A;        Home Medications    Prior to Admission medications   Medication Sig Start Date End Date Taking? Authorizing Provider  aspirin 81 MG tablet Take 81 mg by mouth daily.    [provider]  hydrochlorothiazide (HYDRODIURIL) 25 MG tablet Take 25 mg by mouth daily. 11/07/15   [provider]  HYDROcodone-acetaminophen (NORCO) 5-325 MG tablet Take 1 tablet by mouth every 6 (six) hours as needed for moderate pain. 11/11/17   Irine Seal, MD  lisinopril  (PRINIVIL,ZESTRIL) 10 MG tablet Take 10 mg by mouth daily. 10/25/15   [provider]  lovastatin (MEVACOR) 20 MG tablet Take 20 mg by mouth daily at 6 PM.  11/12/15   [provider]  predniSONE (DELTASONE) 20 MG tablet Take 1 tablet (20 mg total) by mouth 2 (two) times daily. 11/13/18   Daleen Bo, MD  saw palmetto 160 MG capsule Take 160 mg by mouth 2 (two) times daily.    [provider]  tamsulosin (FLOMAX) 0.4 MG CAPS capsule Take 1 capsule (0.4 mg total) by mouth daily. 11/11/17   Irine Seal, MD  UNABLE TO FIND Stool softener 1 per day    [provider]    Family History Family History  Problem Relation Age of Onset  . Cancer Brother        prostate  . Cancer Brother        prostate  . Breast cancer Neg Hx   . Colon cancer Neg Hx     Social History Social History   Tobacco Use  . Smoking status: Current Every Day Smoker    Packs/day: 1.50    Years: 52.00    Pack years: 78.00    Types: Cigarettes  . Smokeless tobacco: Never Used  Substance Use Topics  . Alcohol use: No    Alcohol/week: 0.0 standard drinks  . Drug use: No  Allergies   Patient has no known allergies.   Review of Systems Review of Systems  All other systems reviewed and are negative.    Physical Exam Updated Vital Signs BP (!) 165/82 (BP Location: Right Arm)   Pulse (!) 101   Temp 98 F (36.7 C) (Oral)   Resp 16   Ht 6\' 2"  (1.88 m)   Wt 88.5 kg   SpO2 100%   BMI 25.04 kg/m   Physical Exam Vitals signs and nursing note reviewed.  Constitutional:      General: He is not in acute distress.    Appearance: He is well-developed. He is not ill-appearing, toxic-appearing or diaphoretic.  HENT:     Head: Normocephalic and atraumatic.     Right Ear: External ear normal.     Left Ear: External ear normal.  Eyes:     Conjunctiva/sclera: Conjunctivae normal.     Pupils: Pupils are equal, round, and reactive to light.  Neck:     Musculoskeletal:  Normal range of motion and neck supple.     Trachea: Phonation normal.  Cardiovascular:     Rate and Rhythm: Normal rate.  Pulmonary:     Effort: Pulmonary effort is normal.  Musculoskeletal:     Comments: Left dorsum wrist and hand tender, red and swollen.  He resists extension of the left wrist secondary to pain.  Fair flexion strength left wrist and hand.  No palmar, or volar wrist abnormality.  Skin:    General: Skin is warm and dry.  Neurological:     Mental Status: He is alert and oriented to person, place, and time.     Cranial Nerves: No cranial nerve deficit.     Sensory: No sensory deficit.     Motor: No abnormal muscle tone.     Coordination: Coordination normal.  Psychiatric:        Behavior: Behavior normal.        Thought Content: Thought content normal.        Judgment: Judgment normal.      ED Treatments / Results  Labs (all labs ordered are listed, but only abnormal results are displayed) Labs Reviewed - No data to display  EKG None  Radiology Dg Hand Complete Left  Result Date: 11/13/2018 CLINICAL DATA:  Swelling.  No injury.  History gout. EXAM: LEFT HAND - COMPLETE 3+ VIEW COMPARISON:  None. FINDINGS: Dorsal soft tissue swelling centered about the level of the metacarpal phalangeal joints. Overlap of fingers on the lateral view. No soft tissue gas or radiopaque foreign object. No acute fracture or dislocation. No significant joint space narrowing or periarticular erosion. IMPRESSION: Soft tissue swelling, without acute osseous abnormality. Electronically Signed   By: Abigail Miyamoto M.D.   On: 11/13/2018 10:37    Procedures Procedures (including critical care time)  Medications Ordered in ED Medications  predniSONE (DELTASONE) tablet 60 mg (60 mg Oral Given 11/13/18 1036)     Initial Impression / Assessment and Plan / ED Course  I have reviewed the triage vital signs and the nursing notes.  Pertinent labs & imaging results that were available during  my care of the patient were reviewed by me and considered in my medical decision making (see chart for details).  Clinical Course as of Nov 13 1051  Sun Nov 13, 2018  1052 No fracture or dislocation, soft tissue swelling dorsally, images reviewed by me  DG Hand Complete Left [EW]    Clinical Course User Index [EW] Daleen Bo,  MD        Patient Vitals for the past 24 hrs:  BP Temp Temp src Pulse Resp SpO2 Height Weight  11/13/18 1020 (!) 165/82 98 F (36.7 C) Oral (!) 101 16 100 % - -  11/13/18 1018 - - - - - - 6\' 2"  (1.88 m) 88.5 kg    10:51 AM Reevaluation with update and discussion. After initial assessment and treatment, an updated evaluation reveals no change in clinical status, findings discussed with the patient and all questions were answered. Daleen Bo   Medical Decision Making: Evaluation consistent with gout left hand and wrist.  Doubt fracture, osteomyelitis, or cervical radiculopathy  CRITICAL CARE-no Performed by: Daleen Bo  Nursing Notes Reviewed/ Care Coordinated Applicable Imaging Reviewed Interpretation of Laboratory Data incorporated into ED treatment  The patient appears reasonably screened and/or stabilized for discharge and I doubt any other medical condition or other [facility] requiring further screening, evaluation, or treatment in the ED at this time prior to discharge.  Plan: Home Medications-continue routine medications; Home Treatments-rest, elevation, heat therapy; return here if the recommended treatment, does not improve the symptoms; Recommended follow up-PCP checkup 1 week and as needed  Final Clinical Impressions(s) / ED Diagnoses   Final diagnoses:  Acute gout of left wrist, unspecified cause    ED Discharge Orders         Ordered    predniSONE (DELTASONE) 20 MG tablet  2 times daily     11/13/18 1050           Daleen Bo, MD 11/13/18 1053

## 2018-11-13 NOTE — Discharge Instructions (Signed)
We sent a prescription to your pharmacy to pick up and start taking tomorrow morning.  For the pain, elevate your left wrist above your heart as much as possible.  Also using a heating pad on it can help the pain.  Try doing this 3 or 4 times a day.  Follow-up with your doctor for checkup next week.

## 2019-03-31 ENCOUNTER — Other Ambulatory Visit: Payer: Self-pay

## 2019-03-31 ENCOUNTER — Ambulatory Visit: Payer: Medicare Other | Admitting: Urology

## 2019-03-31 DIAGNOSIS — Z8546 Personal history of malignant neoplasm of prostate: Secondary | ICD-10-CM

## 2019-03-31 DIAGNOSIS — R351 Nocturia: Secondary | ICD-10-CM | POA: Diagnosis not present

## 2019-07-20 IMAGING — DX LEFT HAND - COMPLETE 3+ VIEW
3 series · 3 of 3 positions shown · non-contrast
Comparison: None.

CLINICAL DATA: Swelling.  No injury.  History gout.

EXAM:
LEFT HAND - COMPLETE 3+ VIEW

[hand pa]
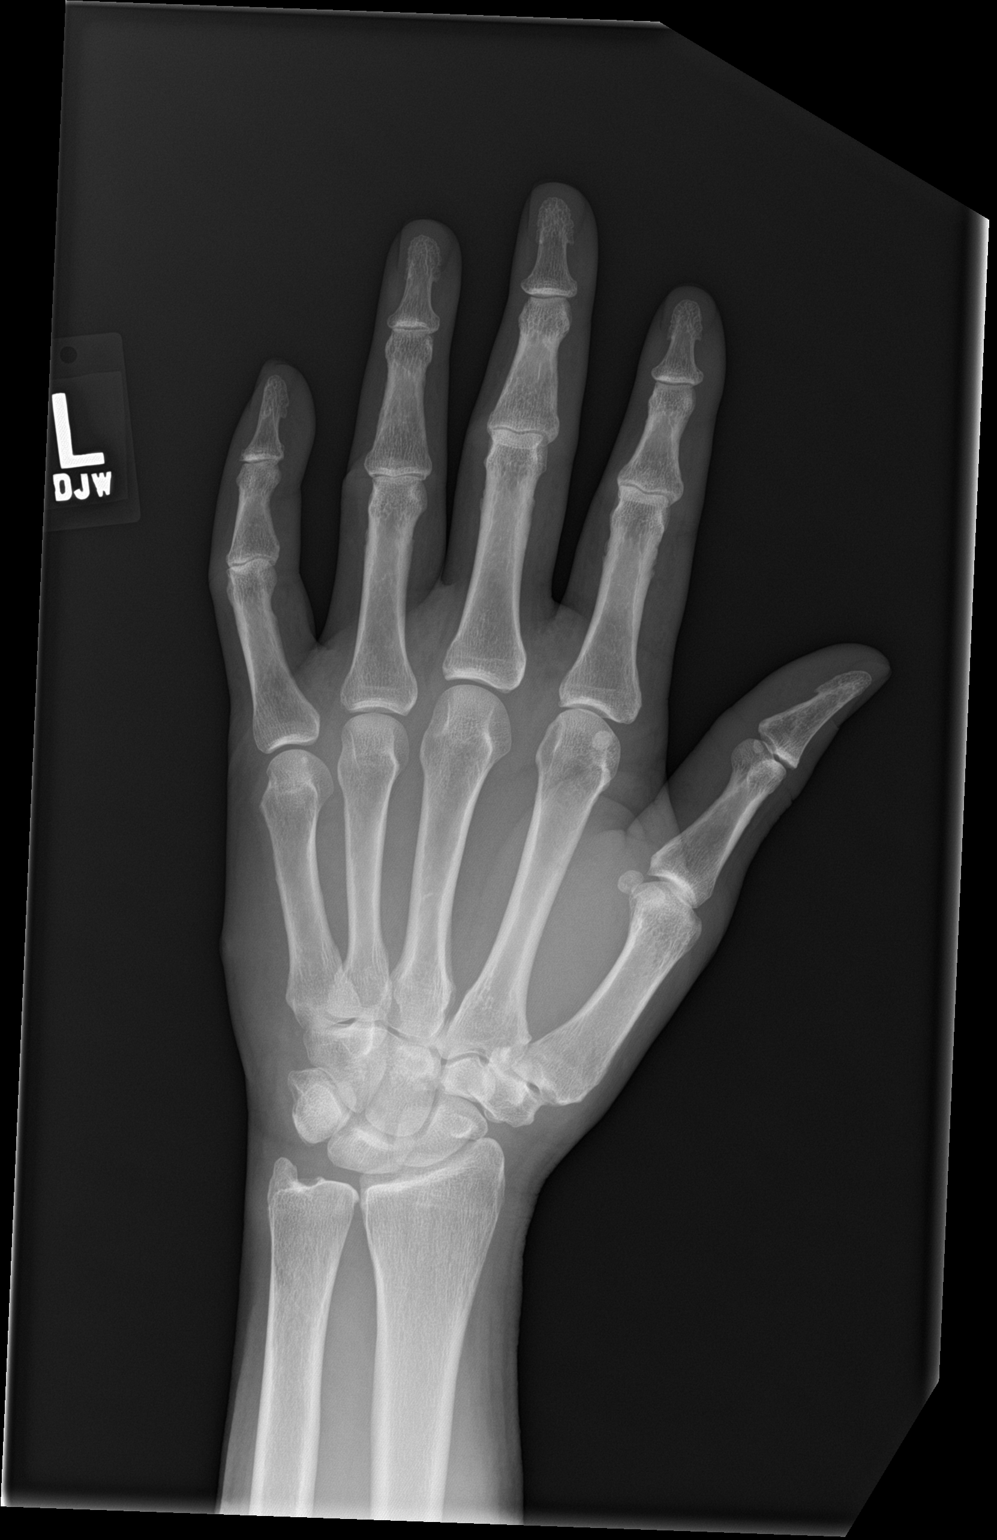

[hand obl]
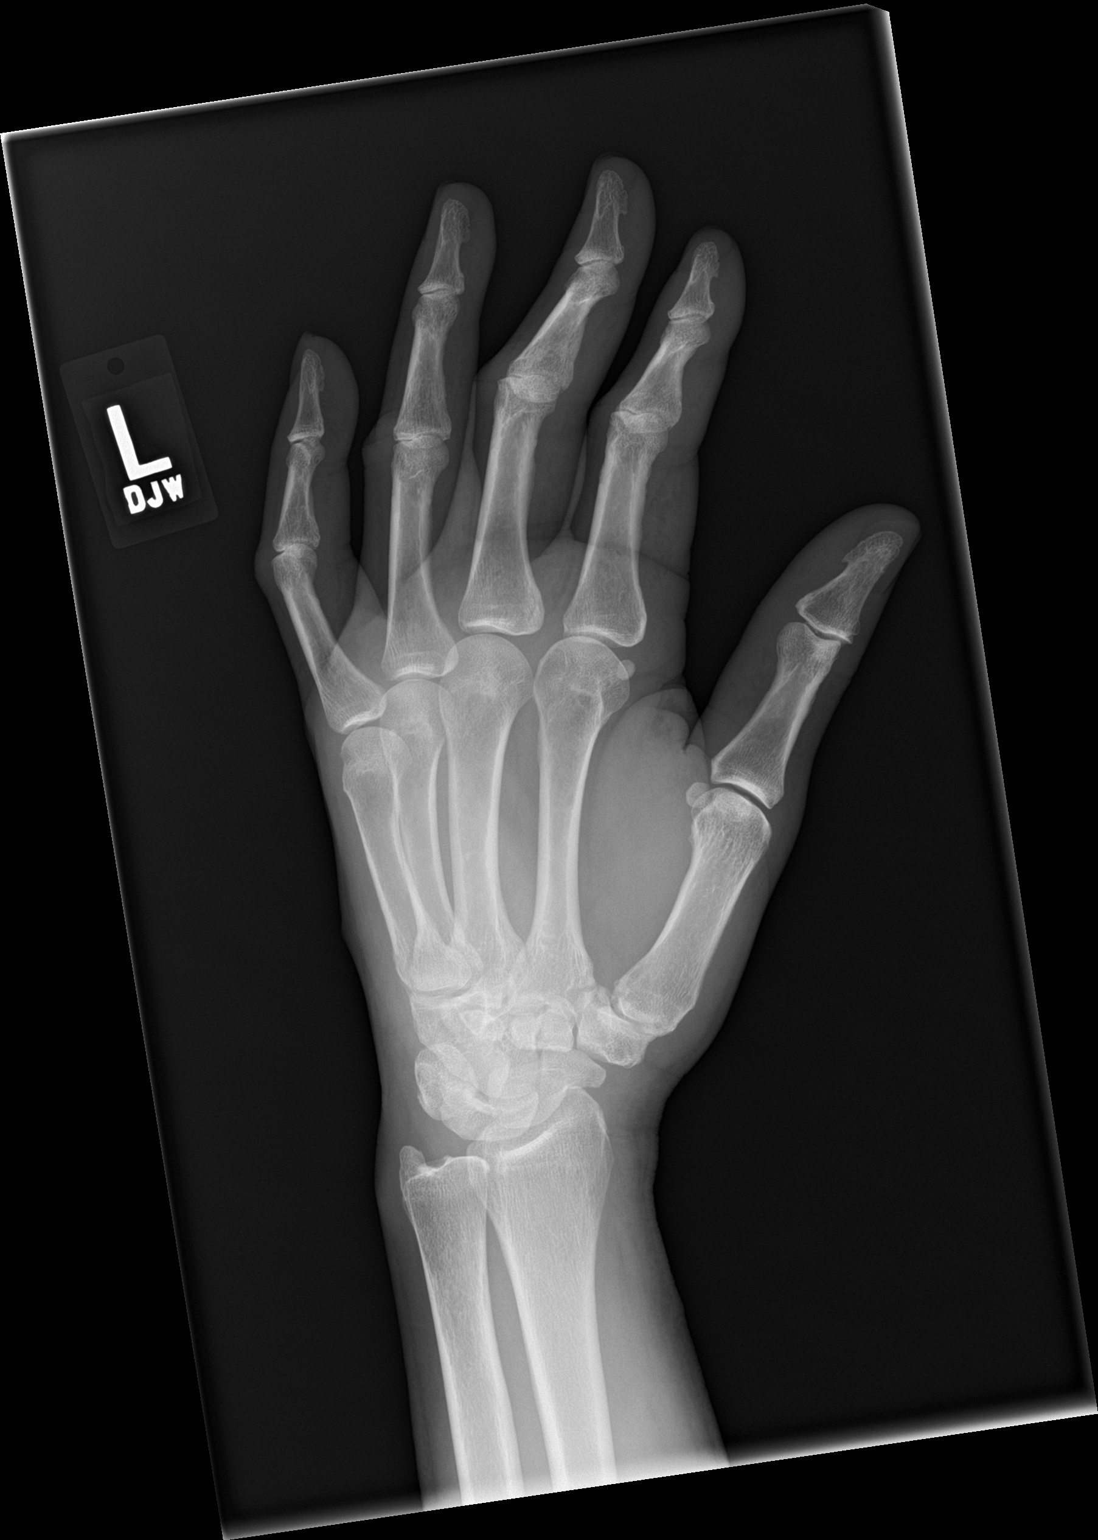

[hand lat]
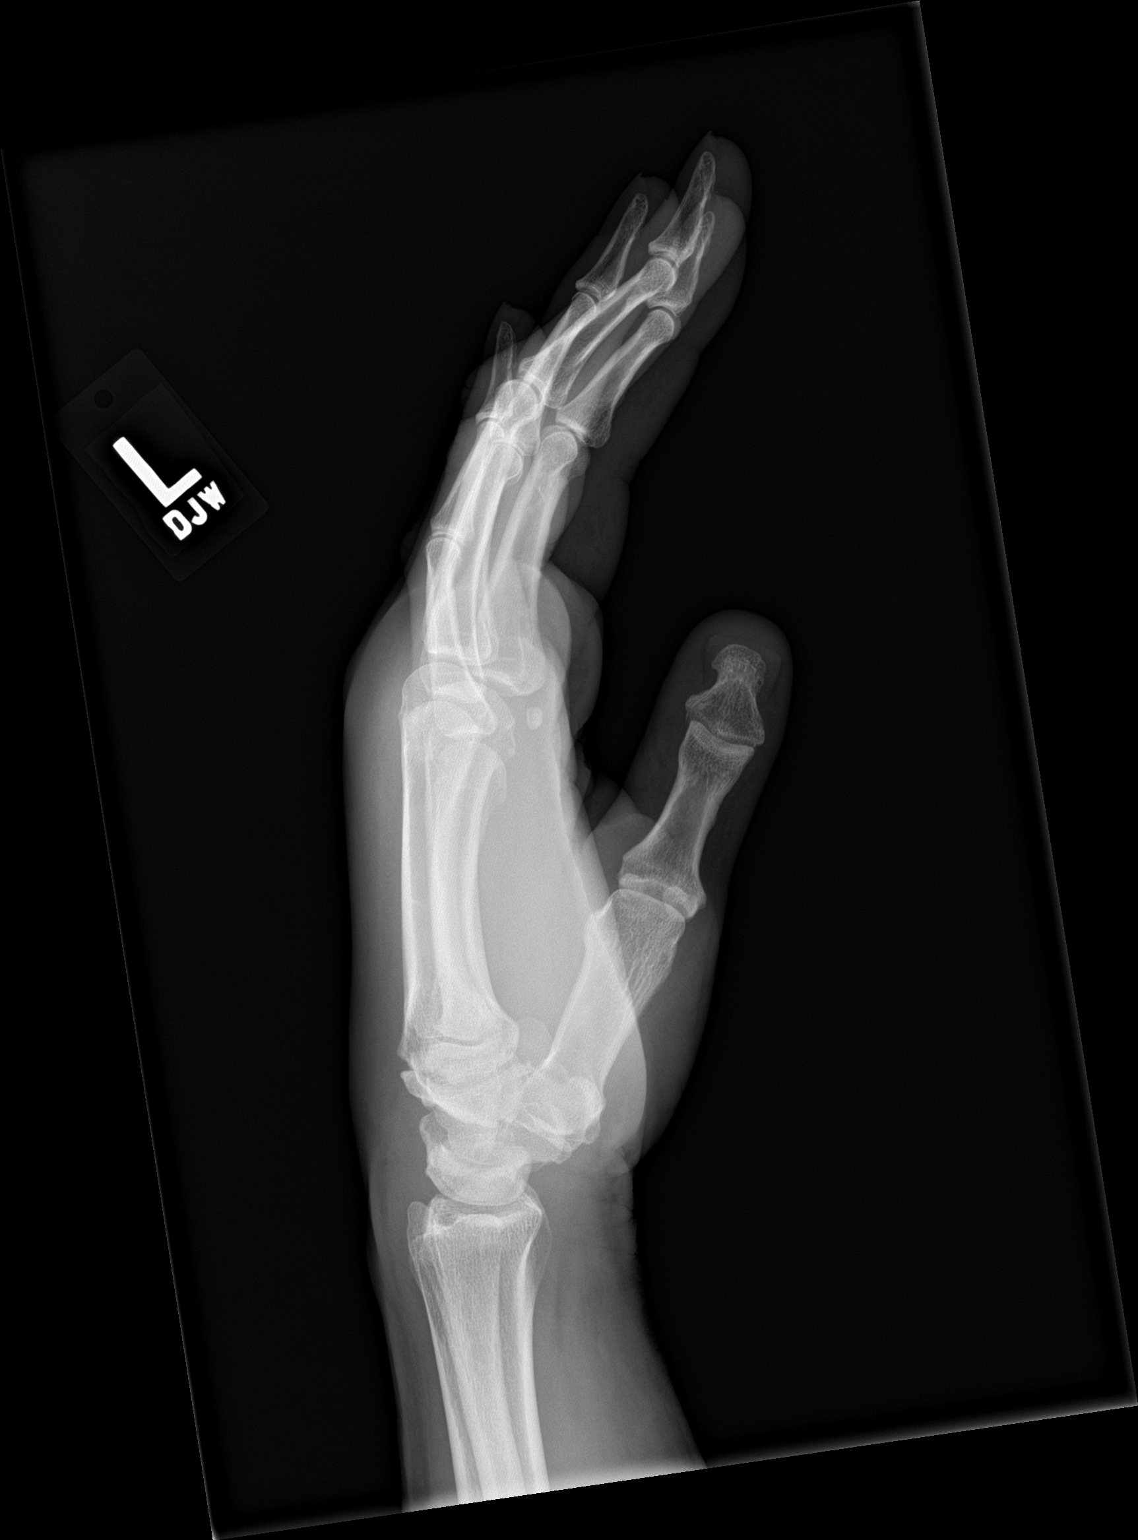

[3 of 3 positions shown; findings below may reference images not displayed]

FINDINGS: Dorsal soft tissue swelling centered about the level of the
metacarpal phalangeal joints. Overlap of fingers on the lateral
view. No soft tissue gas or radiopaque foreign object. No acute
fracture or dislocation. No significant joint space narrowing or
periarticular erosion.
IMPRESSION: Soft tissue swelling, without acute osseous abnormality.

## 2019-08-04 ENCOUNTER — Other Ambulatory Visit: Payer: Self-pay

## 2019-08-04 DIAGNOSIS — C61 Malignant neoplasm of prostate: Secondary | ICD-10-CM

## 2019-09-05 ENCOUNTER — Encounter: Payer: Self-pay | Admitting: Urology

## 2019-11-24 ENCOUNTER — Encounter: Payer: Self-pay | Admitting: Urology

## 2019-11-24 ENCOUNTER — Other Ambulatory Visit: Payer: Self-pay

## 2019-11-24 ENCOUNTER — Ambulatory Visit: Payer: Medicare Other | Admitting: Urology

## 2019-11-24 VITALS — BP 138/70 | HR 70 | Temp 98.2°F | Ht 74.0 in | Wt 224.0 lb

## 2019-11-24 DIAGNOSIS — Z8546 Personal history of malignant neoplasm of prostate: Secondary | ICD-10-CM | POA: Diagnosis not present

## 2019-11-24 DIAGNOSIS — N138 Other obstructive and reflux uropathy: Secondary | ICD-10-CM

## 2019-11-24 DIAGNOSIS — N401 Enlarged prostate with lower urinary tract symptoms: Secondary | ICD-10-CM | POA: Diagnosis not present

## 2019-11-24 DIAGNOSIS — R350 Frequency of micturition: Secondary | ICD-10-CM

## 2019-11-24 DIAGNOSIS — R351 Nocturia: Secondary | ICD-10-CM

## 2019-11-24 LAB — POCT URINALYSIS DIPSTICK
Bilirubin, UA: NEGATIVE
Blood, UA: NEGATIVE
Glucose, UA: NEGATIVE
Ketones, UA: NEGATIVE
Leukocytes, UA: NEGATIVE
Nitrite, UA: NEGATIVE
Protein, UA: NEGATIVE
Spec Grav, UA: 1.01 (ref 1.010–1.025)
Urobilinogen, UA: 0.2 E.U./dL
pH, UA: 5 (ref 5.0–8.0)

## 2019-11-24 NOTE — Progress Notes (Signed)
Subjective:  1. History of prostate cancer   2. BPH with urinary obstruction   3. Urinary frequency   4. Nocturia       I had prostate cancer treated.  HPI: Donald Pollard is a 69 year-old male established patient who is here for prostate cancer which has been treated.  He received radiation therapy for his cancer. He was treated with Brachytherapy for his cancer. His radiation treatment was complete 11/11/2017.   Donald Pollard returns after undergoing radioactive seed implantation on 11/11/17. His PSA is stable at 0.5 on 07/24/19. It was 10.9 prior to therapy. He has had improvement in his LUTS but still has nocturia x 4 and has some frequency. He remains tamsulosin. He has had no further hematuria. He has no GI complaints. He has no bone pain or weight loss.   Previously he was found to have T2a Nx Mx Gleason 7(3+4) prostate cancer in 10% of the right base medial biopsy. The ROI biopsies were negative. His recent IPSS was only 5. The [facility] nomogram predicts 56% chance of OCD, 43% ECE, 2% LNI and 1% SVI. His prostate volume was 75ml. He his PSA was 10.9. He had a prior biopsy in 1/16 for a PSA of 8.1. that biopsy was negative.      IPSS    Row Name 11/24/19 1500         International Prostate Symptom Score   How often have you had the sensation of not emptying your bladder?  Not at All     How often have you had to urinate less than every two hours?  More than half the time     How often have you found you stopped and started again several times when you urinated?  Less than half the time     How often have you found it difficult to postpone urination?  Not at All     How often have you had a weak urinary stream?  Less than half the time     How often have you had to strain to start urination?  Not at All     How many times did you typically get up at night to urinate?  4 Times     Total IPSS Score  12       Quality of Life due to urinary symptoms   If you were to spend the rest of  your life with your urinary condition just the way it is now how would you feel about that?  Pleased         ROS:  ROS:  A complete review of systems was performed.  All systems are negative except for pertinent findings as noted.   Review of Systems  Respiratory: Positive for cough.   Skin: Positive for rash.  All other systems reviewed and are negative.   No Known Allergies  Outpatient Encounter Medications as of 11/24/2019  Medication Sig Note  . aspirin 81 MG tablet Take 81 mg by mouth daily.   . febuxostat (ULORIC) 40 MG tablet    . hydrochlorothiazide (HYDRODIURIL) 25 MG tablet Take 25 mg by mouth daily. 11/13/2015: Received from: External Pharmacy Received Sig:   . HYDROcodone-acetaminophen (NORCO) 5-325 MG tablet Take 1 tablet by mouth every 6 (six) hours as needed for moderate pain.   Marland Kitchen lisinopril (PRINIVIL,ZESTRIL) 10 MG tablet Take 10 mg by mouth daily. 11/13/2015: Received from: External Pharmacy Received Sig:   . lovastatin (MEVACOR) 20 MG tablet Take 20 mg  by mouth daily at 6 PM.  11/13/2015: Received from: External Pharmacy Received Sig:   . predniSONE (DELTASONE) 20 MG tablet Take 1 tablet (20 mg total) by mouth 2 (two) times daily.   . saw palmetto 160 MG capsule Take 160 mg by mouth 2 (two) times daily.   . tamsulosin (FLOMAX) 0.4 MG CAPS capsule Take 1 capsule (0.4 mg total) by mouth daily.   Marland Kitchen UNABLE TO FIND Stool softener 1 per day    No facility-administered encounter medications on file as of 11/24/2019.    Past Medical History:  Diagnosis Date  . Arthritis   . GERD (gastroesophageal reflux disease)   . Gout   . Hyperlipidemia   . Hypertension   . Prostate cancer [facility])     Past Surgical History:  Procedure Laterality Date  . colonscopy    . PROSTATE BIOPSY    . RADIOACTIVE SEED IMPLANT N/A 11/11/2017   Procedure: RADIOACTIVE SEED IMPLANT/BRACHYTHERAPY IMPLANT;  Surgeon: Irine Seal, MD;  Location: [facility];  Service: Urology;   Laterality: N/A;  . SPACE OAR INSTILLATION N/A 11/11/2017   Procedure: SPACE OAR INSTILLATION;  Surgeon: Irine Seal, MD;  Location: [facility];  Service: Urology;  Laterality: N/A;    Social History   Socioeconomic History  . Marital status: Single    Spouse name: Not on file  . Number of children: 3  . Years of education: Not on file  . Highest education level: Not on file  Occupational History  . Occupation: retired  Tobacco Use  . Smoking status: Current Every Day Smoker    Packs/day: 1.50    Years: 52.00    Pack years: 78.00    Types: Cigarettes  . Smokeless tobacco: Never Used  Substance and Sexual Activity  . Alcohol use: No    Alcohol/week: 0.0 standard drinks  . Drug use: No  . Sexual activity: Yes    Partners: Female  Other Topics Concern  . Not on file  Social History Narrative   Patient has three daughters. Two are local. The youngest daughter lives in [place].    Social Determinants of Health   Financial Resource Strain:   . Difficulty of Paying Living Expenses:   Food Insecurity:   . Worried About Charity fundraiser in the Last Year:   . Arboriculturist in the Last Year:   Transportation Needs:   . Film/video editor (Medical):   Marland Kitchen Lack of Transportation (Non-Medical):   Physical Activity:   . Days of Exercise per Week:   . Minutes of Exercise per Session:   Stress:   . Feeling of Stress :   Social Connections:   . Frequency of Communication with Friends and Family:   . Frequency of Social Gatherings with Friends and Family:   . Attends Religious Services:   . Active Member of Clubs or Organizations:   . Attends Archivist Meetings:   Marland Kitchen Marital Status:   Intimate Partner Violence:   . Fear of Current or Ex-Partner:   . Emotionally Abused:   Marland Kitchen Physically Abused:   . Sexually Abused:     Family History  Problem Relation Age of Onset  . Cancer Brother        prostate  . Cancer Brother        prostate  .  Breast cancer Neg Hx   . Colon cancer Neg Hx        Objective: Vitals:  11/24/19 1502  BP: 138/70  Pulse: 70  Temp: 98.2 F (36.8 C)     Physical Exam Vitals reviewed.  Constitutional:      Appearance: Normal appearance.  Neurological:     Mental Status: He is alert.     Lab Results:  Results for orders placed or performed in visit on 11/24/19 (from the past 24 hour(s))  POCT urinalysis dipstick     Status: None   Collection Time: 11/24/19  3:12 PM  Result Value Ref Range   Color, UA yellow    Clarity, UA     Glucose, UA Negative Negative   Bilirubin, UA neg    Ketones, UA neg    Spec Grav, UA 1.010 1.010 - 1.025   Blood, UA neg    pH, UA 5.0 5.0 - 8.0   Protein, UA Negative Negative   Urobilinogen, UA 0.2 0.2 or 1.0 E.U./dL   Nitrite, UA neg    Leukocytes, UA Negative Negative   Appearance clear    Odor      BMET No results for input(s): NA, K, CL, CO2, GLUCOSE, BUN, CREATININE, CALCIUM in the last 72 hours. PSA No results found for: PSA No results found for: TESTOSTERONE    Studies/Results: PSA 0.5 on 07/23/20.    Epic records reviewed and labs from his PCP reviewed.   Assessment & Plan: History of prostate cancer.  He is doing well with a low PSA.  I will repeat it today and have him return in 6 months with a PSA for an exam.  BPH with BOO with frequency and nocturia.  He remains on tamsulosin but he asked about stopping it and I think that is ok for him to try.    No orders of the defined types were placed in this encounter.    Orders Placed This Encounter  Procedures  . PSA    Standing Status:   Future    Standing Expiration Date:   11/23/2020  . PSA    Standing Status:   Future    Standing Expiration Date:   11/23/2020  . POCT urinalysis dipstick      Return in about 6 months (around 05/25/2020) for With a PSA. Marland Kitchen   CC: Renee Rival, NP      Irine Seal 11/24/2019

## 2020-05-23 NOTE — Progress Notes (Signed)
Subjective:  1. History of prostate cancer   2. BPH with urinary obstruction   3. Nocturia       I had prostate cancer treated.  HPI: Donald Pollard is a 69 year-old male established patient who is here for prostate cancer which has been treated.  He received radiation therapy for his cancer. He was treated with Brachytherapy for his cancer. His radiation treatment was complete 11/11/2017.   Donald Pollard returns after undergoing radioactive seed implantation on 11/11/17. His last PSA was stable at 0.5 on 07/24/19 and says a more recent level was near 0 at his PCP's office.  It was 10.9 prior to therapy. He has stable LUTS but still has nocturia x 5 and has some frequency. He remains on tamsulosin and his symptoms worsened when he ran out.  He has had no further hematuria. He has no GI complaints. He has no bone pain or weight loss.   Previously he was found to have T2a Nx Mx Gleason 7(3+4) prostate cancer in 10% of the right base medial biopsy. The ROI biopsies were negative. His recent IPSS was only 5. The [facility] nomogram predicts 56% chance of OCD, 43% ECE, 2% LNI and 1% SVI. His prostate volume was 67ml. He his PSA was 10.9. He had a prior biopsy in 1/16 for a PSA of 8.1. that biopsy was negative.       IPSS    Row Name 05/24/20 1400         International Prostate Symptom Score   How often have you had the sensation of not emptying your bladder? About half the time     How often have you had to urinate less than every two hours? About half the time     How often have you found you stopped and started again several times when you urinated? Less than half the time     How often have you found it difficult to postpone urination? Less than 1 in 5 times     How often have you had a weak urinary stream? About half the time     How often have you had to strain to start urination? Less than 1 in 5 times     How many times did you typically get up at night to urinate? 5 Times     Total IPSS  Score 18       Quality of Life due to urinary symptoms   If you were to spend the rest of your life with your urinary condition just the way it is now how would you feel about that? Mostly Disatisfied             ROS:  ROS:  A complete review of systems was performed.  All systems are negative except for pertinent findings as noted.   Review of Systems  Respiratory: Positive for cough.   Skin: Positive for rash.  All other systems reviewed and are negative.   No Known Allergies  Outpatient Encounter Medications as of 05/24/2020  Medication Sig Note  . aspirin 81 MG tablet Take 81 mg by mouth daily.   . febuxostat (ULORIC) 40 MG tablet    . hydrochlorothiazide (HYDRODIURIL) 25 MG tablet Take 25 mg by mouth daily. 11/13/2015: Received from: External Pharmacy Received Sig:   . HYDROcodone-acetaminophen (NORCO) 5-325 MG tablet Take 1 tablet by mouth every 6 (six) hours as needed for moderate pain.   Marland Kitchen lisinopril (PRINIVIL,ZESTRIL) 10 MG tablet Take 10 mg by mouth  daily. 11/13/2015: Received from: External Pharmacy Received Sig:   . lovastatin (MEVACOR) 20 MG tablet Take 20 mg by mouth daily at 6 PM.  11/13/2015: Received from: External Pharmacy Received Sig:   . predniSONE (DELTASONE) 20 MG tablet Take 1 tablet (20 mg total) by mouth 2 (two) times daily.   . saw palmetto 160 MG capsule Take 160 mg by mouth 2 (two) times daily.   . tamsulosin (FLOMAX) 0.4 MG CAPS capsule Take 2 capsules (0.8 mg total) by mouth daily.   Marland Kitchen UNABLE TO FIND Stool softener 1 per day   . [DISCONTINUED] tamsulosin (FLOMAX) 0.4 MG CAPS capsule Take 1 capsule (0.4 mg total) by mouth daily.    No facility-administered encounter medications on file as of 05/24/2020.    Past Medical History:  Diagnosis Date  . Arthritis   . GERD (gastroesophageal reflux disease)   . Gout   . Hyperlipidemia   . Hypertension   . Prostate cancer [facility])     Past Surgical History:  Procedure Laterality Date  . colonscopy    .  PROSTATE BIOPSY    . RADIOACTIVE SEED IMPLANT N/A 11/11/2017   Procedure: RADIOACTIVE SEED IMPLANT/BRACHYTHERAPY IMPLANT;  Surgeon: Irine Seal, MD;  Location: [facility];  Service: Urology;  Laterality: N/A;  . SPACE OAR INSTILLATION N/A 11/11/2017   Procedure: SPACE OAR INSTILLATION;  Surgeon: Irine Seal, MD;  Location: [facility];  Service: Urology;  Laterality: N/A;    Social History   Socioeconomic History  . Marital status: Single    Spouse name: Not on file  . Number of children: 3  . Years of education: Not on file  . Highest education level: Not on file  Occupational History  . Occupation: retired  Tobacco Use  . Smoking status: Current Every Day Smoker    Packs/day: 1.50    Years: 52.00    Pack years: 78.00    Types: Cigarettes  . Smokeless tobacco: Never Used  Vaping Use  . Vaping Use: Never used  Substance and Sexual Activity  . Alcohol use: No    Alcohol/week: 0.0 standard drinks  . Drug use: No  . Sexual activity: Yes    Partners: Female  Other Topics Concern  . Not on file  Social History Narrative   Patient has three daughters. Two are local. The youngest daughter lives in [place].    Social Determinants of Health   Financial Resource Strain:   . Difficulty of Paying Living Expenses: Not on file  Food Insecurity:   . Worried About Charity fundraiser in the Last Year: Not on file  . Ran Out of Food in the Last Year: Not on file  Transportation Needs:   . Lack of Transportation (Medical): Not on file  . Lack of Transportation (Non-Medical): Not on file  Physical Activity:   . Days of Exercise per Week: Not on file  . Minutes of Exercise per Session: Not on file  Stress:   . Feeling of Stress : Not on file  Social Connections:   . Frequency of Communication with Friends and Family: Not on file  . Frequency of Social Gatherings with Friends and Family: Not on file  . Attends Religious Services: Not on file  . Active  Member of Clubs or Organizations: Not on file  . Attends Archivist Meetings: Not on file  . Marital Status: Not on file  Intimate Partner Violence:   . Fear of Current or Ex-Partner:  Not on file  . Emotionally Abused: Not on file  . Physically Abused: Not on file  . Sexually Abused: Not on file    Family History  Problem Relation Age of Onset  . Cancer Brother        prostate  . Cancer Brother        prostate  . Breast cancer Neg Hx   . Colon cancer Neg Hx        Objective: Vitals:   05/24/20 1412  BP: (!) 144/77  Pulse: 79  Temp: 98.4 F (36.9 C)     Physical Exam Vitals reviewed.  Constitutional:      Appearance: Normal appearance.  Genitourinary:    Comments: AP with a small hemorrhoid. NST without mass. Prostate 2+ firm, right > left. SV non-palpable.  Neurological:     Mental Status: He is alert.     Lab Results:  No results found for this or any previous visit (from the past 24 hour(s)).  BMET No results for input(s): NA, K, CL, CO2, GLUCOSE, BUN, CREATININE, CALCIUM in the last 72 hours. PSA No results found for: PSA No results found for: TESTOSTERONE No results found for this or any previous visit (from the past 24 hour(s)).  UA is clear.   Studies/Results: PSA 0.5 on 07/24/19.    Epic records reviewed and labs from his PCP reviewed.   Assessment & Plan: History of prostate cancer.  He is doing well with a low PSA.  I will request the recent results from his PCP and  I will have him return in 6 months with a PSA.  BPH with BOO with frequency and nocturia.  He remains on tamsulosin and I am going to double that up.   Meds ordered this encounter  Medications  . tamsulosin (FLOMAX) 0.4 MG CAPS capsule    Sig: Take 2 capsules (0.8 mg total) by mouth daily.    Dispense:  180 capsule    Refill:  3     Orders Placed This Encounter  Procedures  . Urinalysis, Routine w reflex microscopic      Return in about 6 months (around  11/22/2020) for with PSA.   CC: Renee Rival, NP      Irine Seal 05/24/2020

## 2020-05-24 ENCOUNTER — Other Ambulatory Visit: Payer: Self-pay

## 2020-05-24 ENCOUNTER — Ambulatory Visit (INDEPENDENT_AMBULATORY_CARE_PROVIDER_SITE_OTHER): Payer: Medicare PPO | Admitting: Urology

## 2020-05-24 ENCOUNTER — Encounter: Payer: Self-pay | Admitting: Urology

## 2020-05-24 ENCOUNTER — Ambulatory Visit: Payer: Medicare PPO | Admitting: Urology

## 2020-05-24 VITALS — BP 144/77 | HR 79 | Temp 98.4°F | Ht 74.0 in | Wt 224.0 lb

## 2020-05-24 DIAGNOSIS — R351 Nocturia: Secondary | ICD-10-CM | POA: Diagnosis not present

## 2020-05-24 DIAGNOSIS — N401 Enlarged prostate with lower urinary tract symptoms: Secondary | ICD-10-CM | POA: Diagnosis not present

## 2020-05-24 DIAGNOSIS — Z8546 Personal history of malignant neoplasm of prostate: Secondary | ICD-10-CM

## 2020-05-24 DIAGNOSIS — N138 Other obstructive and reflux uropathy: Secondary | ICD-10-CM

## 2020-05-24 LAB — URINALYSIS, ROUTINE W REFLEX MICROSCOPIC
Bilirubin, UA: NEGATIVE
Glucose, UA: NEGATIVE
Ketones, UA: NEGATIVE
Leukocytes,UA: NEGATIVE
Nitrite, UA: NEGATIVE
Protein,UA: NEGATIVE
RBC, UA: NEGATIVE
Specific Gravity, UA: 1.01 (ref 1.005–1.030)
Urobilinogen, Ur: 0.2 mg/dL (ref 0.2–1.0)
pH, UA: 5.5 (ref 5.0–7.5)

## 2020-05-24 MED ORDER — TAMSULOSIN HCL 0.4 MG PO CAPS: 0.8000 mg | ORAL_CAPSULE | Freq: Every day | ORAL | 3 refills | Status: DC

## 2020-05-24 NOTE — Progress Notes (Signed)
Urological Symptom Review  Patient is experiencing the following symptoms: Get up at night to urinate Leakage of urine Trouble starting stream   Review of Systems  Gastrointestinal (upper)  : Negative for upper GI symptoms  Gastrointestinal (lower) : Negative for lower GI symptoms  Constitutional : Negative for symptoms  Skin: Negative for skin symptoms  Eyes: Negative for eye symptoms  Ear/Nose/Throat : Negative for Ear/Nose/Throat symptoms  Hematologic/Lymphatic: Negative for Hematologic/Lymphatic symptoms  Cardiovascular : Negative for cardiovascular symptoms  Respiratory : Cough  Endocrine: Negative for endocrine symptoms  Musculoskeletal: Negative for musculoskeletal symptoms  Neurological: Negative for neurological symptoms  Psychologic: Negative for psychiatric symptoms

## 2020-05-27 ENCOUNTER — Other Ambulatory Visit: Payer: Self-pay

## 2020-06-04 ENCOUNTER — Telehealth: Payer: Self-pay | Admitting: Urology

## 2020-06-04 NOTE — Telephone Encounter (Signed)
Pt called with questions about his medication. Please call him when you can.

## 2020-06-05 NOTE — Telephone Encounter (Signed)
You saw pt on 10/6 and doubled his flomax to 2 capsules daily.  I spoke with him today and still is getting up a lot at night. Too soon to notice a difference? Or does he need something else?

## 2020-06-06 ENCOUNTER — Other Ambulatory Visit: Payer: Self-pay

## 2020-06-06 MED ORDER — OXYBUTYNIN CHLORIDE 5 MG PO TABS: ORAL_TABLET | ORAL | 3 refills | Status: DC

## 2020-06-06 NOTE — Telephone Encounter (Signed)
We could try him on oxybutynin IR 5mg  po qhs #30 with refills and see if that helps.

## 2020-06-06 NOTE — Telephone Encounter (Signed)
Rx sent. Unable to call pt or leave mes.

## 2020-07-30 ENCOUNTER — Other Ambulatory Visit: Payer: Self-pay

## 2020-11-14 ENCOUNTER — Other Ambulatory Visit: Payer: Medicare PPO

## 2020-11-21 ENCOUNTER — Ambulatory Visit (INDEPENDENT_AMBULATORY_CARE_PROVIDER_SITE_OTHER): Payer: Medicare PPO | Admitting: Urology

## 2020-11-21 ENCOUNTER — Encounter: Payer: Self-pay | Admitting: Urology

## 2020-11-21 ENCOUNTER — Other Ambulatory Visit: Payer: Self-pay

## 2020-11-21 VITALS — BP 151/71 | HR 76 | Temp 98.4°F | Ht 74.0 in | Wt 201.0 lb

## 2020-11-21 DIAGNOSIS — N138 Other obstructive and reflux uropathy: Secondary | ICD-10-CM

## 2020-11-21 DIAGNOSIS — N401 Enlarged prostate with lower urinary tract symptoms: Secondary | ICD-10-CM

## 2020-11-21 DIAGNOSIS — R351 Nocturia: Secondary | ICD-10-CM | POA: Diagnosis not present

## 2020-11-21 DIAGNOSIS — Z8546 Personal history of malignant neoplasm of prostate: Secondary | ICD-10-CM

## 2020-11-21 DIAGNOSIS — R350 Frequency of micturition: Secondary | ICD-10-CM

## 2020-11-21 LAB — MICROSCOPIC EXAMINATION
Bacteria, UA: NONE SEEN
Epithelial Cells (non renal): NONE SEEN /hpf (ref 0–10)
Renal Epithel, UA: NONE SEEN /hpf
WBC, UA: NONE SEEN /hpf (ref 0–5)

## 2020-11-21 LAB — URINALYSIS, ROUTINE W REFLEX MICROSCOPIC
Bilirubin, UA: NEGATIVE
Glucose, UA: NEGATIVE
Ketones, UA: NEGATIVE
Leukocytes,UA: NEGATIVE
Nitrite, UA: NEGATIVE
Protein,UA: NEGATIVE
Specific Gravity, UA: 1.025 (ref 1.005–1.030)
Urobilinogen, Ur: 1 mg/dL (ref 0.2–1.0)
pH, UA: 5.5 (ref 5.0–7.5)

## 2020-11-21 NOTE — Progress Notes (Signed)
Urological Symptom Review  Patient is experiencing the following symptoms: none   Review of Systems  Gastrointestinal (upper)  : Negative for upper GI symptoms  Gastrointestinal (lower) : Negative for lower GI symptoms  Constitutional : Negative for symptoms  Skin: Negative for skin symptoms  Eyes: Negative for eye symptoms  Ear/Nose/Throat : Negative for Ear/Nose/Throat symptoms  Hematologic/Lymphatic: Easy bruising  Cardiovascular : Negative for cardiovascular symptoms  Respiratory : Negative for respiratory symptoms  Endocrine: Negative for endocrine symptoms  Musculoskeletal: Negative for musculoskeletal symptoms  Neurological: Negative for neurological symptoms  Psychologic: Negative for psychiatric symptoms  

## 2020-11-21 NOTE — Progress Notes (Signed)
Subjective:  1. History of prostate cancer   2. BPH with urinary obstruction   3. Nocturia   4. Urinary frequency      HPI: Donald Pollard is a 70 year-old male established patient who is here for prostate cancer which has been treated.  He received radiation therapy for his cancer. He was treated with Brachytherapy for his cancer. His radiation treatment was complete 11/11/2017.  His PSA in 12/21 was 0.2.  His UA has 0-2 RBC's.  His IPSS is 12.  He remains on tamsulosin and is taking and OTC prostate supplement.  He has nocturia x 3, but he doesn't wake with urge.  He has some mild daytime frequency and he has a good stream.  He has no incontinence.   He had been on oxybutynin 5mg  at bedtime but it didn't help and he is off of that.   Donald Pollard returns after undergoing radioactive seed implantation on 11/11/17. His last PSA was stable at 0.5 on 07/24/19 and says a more recent level was near 0 at his PCP's office.  It was 10.9 prior to therapy. He has stable LUTS but still has nocturia x 5 and has some frequency. He remains on tamsulosin and his symptoms worsened when he ran out.  He has had no further hematuria. He has no GI complaints. He has no bone pain or weight loss.   Previously he was found to have T2a Nx Mx Gleason 7(3+4) prostate cancer in 10% of the right base medial biopsy. The ROI biopsies were negative. His recent IPSS was only 5. The [facility] nomogram predicts 56% chance of OCD, 43% ECE, 2% LNI and 1% SVI. His prostate volume was 4ml. He his PSA was 10.9. He had a prior biopsy in 1/16 for a PSA of 8.1. that biopsy was negative.         ROS:  ROS:  A complete review of systems was performed.  All systems are negative except for pertinent findings as noted.   ROS  No Known Allergies  Outpatient Encounter Medications as of 11/21/2020  Medication Sig Note  . aspirin 81 MG tablet Take 81 mg by mouth daily.   . febuxostat (ULORIC) 40 MG tablet    . hydrochlorothiazide  (HYDRODIURIL) 25 MG tablet Take 25 mg by mouth daily. 11/13/2015: Received from: External Pharmacy Received Sig:   . HYDROcodone-acetaminophen (NORCO) 5-325 MG tablet Take 1 tablet by mouth every 6 (six) hours as needed for moderate pain.   Marland Kitchen lisinopril (PRINIVIL,ZESTRIL) 10 MG tablet Take 10 mg by mouth daily. 11/13/2015: Received from: External Pharmacy Received Sig:   . lovastatin (MEVACOR) 20 MG tablet Take 20 mg by mouth daily at 6 PM.  11/13/2015: Received from: External Pharmacy Received Sig:   . predniSONE (DELTASONE) 20 MG tablet Take 1 tablet (20 mg total) by mouth 2 (two) times daily.   . tamsulosin (FLOMAX) 0.4 MG CAPS capsule Take 2 capsules (0.8 mg total) by mouth daily.   Marland Kitchen UNABLE TO FIND Stool softener 1 per day   . [DISCONTINUED] oxybutynin (DITROPAN) 5 MG tablet 1 tab po qhs   . saw palmetto 160 MG capsule Take 160 mg by mouth 2 (two) times daily. (Patient not taking: Reported on 11/21/2020)    No facility-administered encounter medications on file as of 11/21/2020.    Past Medical History:  Diagnosis Date  . Arthritis   . GERD (gastroesophageal reflux disease)   . Gout   . Hyperlipidemia   . Hypertension   .  Prostate cancer [facility])     Past Surgical History:  Procedure Laterality Date  . colonscopy    . PROSTATE BIOPSY    . RADIOACTIVE SEED IMPLANT N/A 11/11/2017   Procedure: RADIOACTIVE SEED IMPLANT/BRACHYTHERAPY IMPLANT;  Surgeon: Irine Seal, MD;  Location[facility];  Service: Urology;  Laterality: N/A;  . SPACE OAR INSTILLATION N/A 11/11/2017   Procedure: SPACE OAR INSTILLATION;  Surgeon: Irine Seal, MD;  Location: [facility];  Service: Urology;  Laterality: N/A;    Social History   Socioeconomic History  . Marital status: Single    Spouse name: Not on file  . Number of children: 3  . Years of education: Not on file  . Highest education level: Not on file  Occupational History  . Occupation: retired  Tobacco Use  . Smoking  status: Current Every Day Smoker    Packs/day: 1.50    Years: 52.00    Pack years: 78.00    Types: Cigarettes  . Smokeless tobacco: Never Used  Vaping Use  . Vaping Use: Never used  Substance and Sexual Activity  . Alcohol use: No    Alcohol/week: 0.0 standard drinks  . Drug use: No  . Sexual activity: Yes    Partners: Female  Other Topics Concern  . Not on file  Social History Narrative   Patient has three daughters. Two are local. The youngest daughter lives in [place].    Social Determinants of Health   Financial Resource Strain: Not on file  Food Insecurity: Not on file  Transportation Needs: Not on file  Physical Activity: Not on file  Stress: Not on file  Social Connections: Not on file  Intimate Partner Violence: Not on file    Family History  Problem Relation Age of Onset  . Cancer Brother        prostate  . Cancer Brother        prostate  . Breast cancer Neg Hx   . Colon cancer Neg Hx        Objective: Vitals:   11/21/20 1541  BP: (!) 151/71  Pulse: 76  Temp: 98.4 F (36.9 C)     Physical Exam  Lab Results:  No results found for this or any previous visit (from the past 24 hour(s)).  BMET No results for input(s): NA, K, CL, CO2, GLUCOSE, BUN, CREATININE, CALCIUM in the last 72 hours. PSA No results found for: PSA No results found for: TESTOSTERONE No results found for this or any previous visit (from the past 24 hour(s)).  UA is clear.   Studies/Results: PSA 0.5 on 07/24/19.   PSA 0.2 in 12/21.   Epic records reviewed and labs from his PCP reviewed.   Assessment & Plan: History of prostate cancer.  He is doing well with a low PSA.   I will have him return in 6 months with a PSA.  BPH with BOO with frequency and nocturia.  He remains on tamsulosin 0.8mg  daily with a good response but he didn't get benefit from Oxybutynin.   He has some ejaculatory dysfunction with the tamsulosin and radiation.   No orders of the defined types were  placed in this encounter.    Orders Placed This Encounter  Procedures  . Urinalysis, Routine w reflex microscopic      Return in about 6 months (around 05/23/2021) for with PSA.   CC: Renee Rival, NP      Irine Seal 11/21/2020 Patient ID: [name], male  DOB: 03/14/1951, 70 y.o.   MRN: [id number]

## 2020-11-22 ENCOUNTER — Ambulatory Visit: Payer: Medicare PPO | Admitting: Urology

## 2021-01-27 ENCOUNTER — Other Ambulatory Visit: Payer: Self-pay

## 2021-05-14 ENCOUNTER — Other Ambulatory Visit: Payer: Medicare PPO

## 2021-05-22 ENCOUNTER — Ambulatory Visit: Payer: Medicare PPO | Admitting: Urology

## 2021-05-22 ENCOUNTER — Other Ambulatory Visit: Payer: Self-pay

## 2021-05-22 VITALS — BP 136/77 | HR 68 | Temp 98.4°F | Wt 201.0 lb

## 2021-05-22 DIAGNOSIS — N401 Enlarged prostate with lower urinary tract symptoms: Secondary | ICD-10-CM | POA: Diagnosis not present

## 2021-05-22 DIAGNOSIS — N138 Other obstructive and reflux uropathy: Secondary | ICD-10-CM

## 2021-05-22 DIAGNOSIS — R350 Frequency of micturition: Secondary | ICD-10-CM | POA: Diagnosis not present

## 2021-05-22 DIAGNOSIS — Z8546 Personal history of malignant neoplasm of prostate: Secondary | ICD-10-CM

## 2021-05-22 DIAGNOSIS — R351 Nocturia: Secondary | ICD-10-CM

## 2021-05-22 LAB — URINALYSIS, ROUTINE W REFLEX MICROSCOPIC
Bilirubin, UA: NEGATIVE
Glucose, UA: NEGATIVE
Ketones, UA: NEGATIVE
Leukocytes,UA: NEGATIVE
Nitrite, UA: NEGATIVE
Protein,UA: NEGATIVE
Specific Gravity, UA: 1.01 (ref 1.005–1.030)
Urobilinogen, Ur: 0.2 mg/dL (ref 0.2–1.0)
pH, UA: 5 (ref 5.0–7.5)

## 2021-05-22 LAB — MICROSCOPIC EXAMINATION
Bacteria, UA: NONE SEEN
Epithelial Cells (non renal): NONE SEEN /hpf (ref 0–10)
Renal Epithel, UA: NONE SEEN /hpf
WBC, UA: NONE SEEN /hpf (ref 0–5)

## 2021-05-22 MED ORDER — TAMSULOSIN HCL 0.4 MG PO CAPS: 0.8000 mg | ORAL_CAPSULE | Freq: Every day | ORAL | 3 refills | Status: DC

## 2021-05-22 NOTE — Progress Notes (Signed)

## 2021-05-22 NOTE — Progress Notes (Signed)
Subjective:  1. History of prostate cancer   2. BPH with urinary obstruction   3. Urinary frequency   4. Nocturia      HPI: Donald Pollard is a 70 year-old male established patient who is here for prostate cancer which has been treated.  He received radiation therapy for his cancer. He was treated with Brachytherapy for his cancer. His radiation treatment was complete 11/11/2017.  His PSA on 01/27/21 was <0.1.  His UA has 0-2 RBC's.  His IPSS is 9.  He remains on tamsulosin and is taking and OTC prostate supplement.  He has nocturia x 3, but he doesn't wake with urge.  He has some mild daytime frequency and he has a good stream.  He has no incontinence.   He had been on oxybutynin 5mg  at bedtime but it didn't help and he is off of that.  He has had no hematuria.  He has no GI complaints.   Donald Pollard returns after undergoing radioactive seed implantation on 11/11/17. His last PSA was stable at 0.5 on 07/24/19 and says a more recent level was near 0 at his PCP's office.  It was 10.9 prior to therapy. He has stable LUTS but still has nocturia x 5 and has some frequency. He remains on tamsulosin and his symptoms worsened when he ran out.  He has had no further hematuria. He has no GI complaints. He has no bone pain or weight loss.   Previously he was found to have T2a Nx Mx Gleason 7(3+4) prostate cancer in 10% of the right base medial biopsy. The ROI biopsies were negative. His recent IPSS was only 5. The [facility] nomogram predicts 56% chance of OCD, 43% ECE, 2% LNI and 1% SVI. His prostate volume was 43ml. He his PSA was 10.9. He had a prior biopsy in 1/16 for a PSA of 8.1. that biopsy was negative.         ROS:  ROS:  A complete review of systems was performed.  All systems are negative except for pertinent findings as noted.   ROS  No Known Allergies  Outpatient Encounter Medications as of 05/22/2021  Medication Sig Note   aspirin 81 MG tablet Take 81 mg by mouth daily.    febuxostat  (ULORIC) 40 MG tablet     hydrochlorothiazide (HYDRODIURIL) 25 MG tablet Take 25 mg by mouth daily. 11/13/2015: Received from: External Pharmacy Received Sig:    HYDROcodone-acetaminophen (NORCO) 5-325 MG tablet Take 1 tablet by mouth every 6 (six) hours as needed for moderate pain.    lisinopril (PRINIVIL,ZESTRIL) 10 MG tablet Take 10 mg by mouth daily. 11/13/2015: Received from: External Pharmacy Received Sig:    lovastatin (MEVACOR) 20 MG tablet Take 20 mg by mouth daily at 6 PM.  11/13/2015: Received from: External Pharmacy Received Sig:    predniSONE (DELTASONE) 20 MG tablet Take 1 tablet (20 mg total) by mouth 2 (two) times daily.    tamsulosin (FLOMAX) 0.4 MG CAPS capsule Take 2 capsules (0.8 mg total) by mouth daily.    UNABLE TO FIND Stool softener 1 per day    [DISCONTINUED] saw palmetto 160 MG capsule Take 160 mg by mouth 2 (two) times daily. (Patient not taking: Reported on 11/21/2020)    [DISCONTINUED] tamsulosin (FLOMAX) 0.4 MG CAPS capsule Take 2 capsules (0.8 mg total) by mouth daily.    No facility-administered encounter medications on file as of 05/22/2021.    Past Medical History:  Diagnosis Date   Arthritis  GERD (gastroesophageal reflux disease)    Gout    Hyperlipidemia    Hypertension    Prostate cancer [facility])     Past Surgical History:  Procedure Laterality Date   colonscopy     PROSTATE BIOPSY     RADIOACTIVE SEED IMPLANT N/A 11/11/2017   Procedure: RADIOACTIVE SEED IMPLANT/BRACHYTHERAPY IMPLANT;  Surgeon: Irine Seal, MD;  Location: [facility];  Service: Urology;  Laterality: N/A;   SPACE OAR INSTILLATION N/A 11/11/2017   Procedure: SPACE OAR INSTILLATION;  Surgeon: Irine Seal, MD;  Location: [facility];  Service: Urology;  Laterality: N/A;    Social History   Socioeconomic History   Marital status: Single    Spouse name: Not on file   Number of children: 3   Years of education: Not on file   Highest education level: Not on  file  Occupational History   Occupation: retired  Tobacco Use   Smoking status: Every Day    Packs/day: 1.50    Years: 52.00    Pack years: 78.00    Types: Cigarettes   Smokeless tobacco: Never  Vaping Use   Vaping Use: Never used  Substance and Sexual Activity   Alcohol use: No    Alcohol/week: 0.0 standard drinks   Drug use: No   Sexual activity: Yes    Partners: Female  Other Topics Concern   Not on file  Social History Narrative   Patient has three daughters. Two are local. The youngest daughter lives in [place].    Social Determinants of Health   Financial Resource Strain: Not on file  Food Insecurity: Not on file  Transportation Needs: Not on file  Physical Activity: Not on file  Stress: Not on file  Social Connections: Not on file  Intimate Partner Violence: Not on file    Family History  Problem Relation Age of Onset   Cancer Brother        prostate   Cancer Brother        prostate   Breast cancer Neg Hx    Colon cancer Neg Hx        Objective: Vitals:   05/22/21 1518  BP: 136/77  Pulse: 68  Temp: 98.4 F (36.9 C)     Physical Exam  Lab Results:  Results for orders placed or performed in visit on 05/22/21 (from the past 24 hour(s))  Urinalysis, Routine w reflex microscopic     Status: Abnormal   Collection Time: 05/22/21  3:24 PM  Result Value Ref Range   Specific Gravity, UA 1.010 1.005 - 1.030   pH, UA 5.0 5.0 - 7.5   Color, UA Yellow Yellow   Appearance Ur Clear Clear   Leukocytes,UA Negative Negative   Protein,UA Negative Negative/Trace   Glucose, UA Negative Negative   Ketones, UA Negative Negative   RBC, UA Trace (A) Negative   Bilirubin, UA Negative Negative   Urobilinogen, Ur 0.2 0.2 - 1.0 mg/dL   Nitrite, UA Negative Negative   Microscopic Examination See below:    Narrative   Performed at:  [facility] [address] [place], [place]  [id number] Lab Director: Mina Marble MT, Phone:  [phone]   Microscopic Examination     Status: None   Collection Time: 05/22/21  3:24 PM   Urine  Result Value Ref Range   WBC, UA None seen 0 - 5 /hpf   RBC 0-2 0 - 2 /hpf   Epithelial Cells (non renal) None  seen 0 - 10 /hpf   Renal Epithel, UA None seen None seen /hpf   Bacteria, UA None seen None seen/Few   Narrative   Performed at:  [facility] [address] [place], [place]  [id number] Lab Director: [place], Phone:  [phone]    BMET No results for input(s): NA, K, CL, CO2, GLUCOSE, BUN, CREATININE, CALCIUM in the last 72 hours. PSA No results found for: PSA No results found for: TESTOSTERONE Results for orders placed or performed in visit on 05/22/21 (from the past 24 hour(s))  Urinalysis, Routine w reflex microscopic     Status: Abnormal   Collection Time: 05/22/21  3:24 PM  Result Value Ref Range   Specific Gravity, UA 1.010 1.005 - 1.030   pH, UA 5.0 5.0 - 7.5   Color, UA Yellow Yellow   Appearance Ur Clear Clear   Leukocytes,UA Negative Negative   Protein,UA Negative Negative/Trace   Glucose, UA Negative Negative   Ketones, UA Negative Negative   RBC, UA Trace (A) Negative   Bilirubin, UA Negative Negative   Urobilinogen, Ur 0.2 0.2 - 1.0 mg/dL   Nitrite, UA Negative Negative   Microscopic Examination See below:    Narrative   Performed at:  [facility] [address] [place], [place]  [id number] Lab Director: Mina Marble MT, Phone:  [phone]  Microscopic Examination     Status: None   Collection Time: 05/22/21  3:24 PM   Urine  Result Value Ref Range   WBC, UA None seen 0 - 5 /hpf   RBC 0-2 0 - 2 /hpf   Epithelial Cells (non renal) None seen 0 - 10 /hpf   Renal Epithel, UA None seen None seen /hpf   Bacteria, UA None seen None seen/Few   Narrative   Performed at:  [facility] [address] [place], [place]  [id number] Lab Director: [place], Phone:  [phone]    UA is clear.    Studies/Results: PSA 0.5 on 07/24/19.   PSA 0.2 in 12/21.   Epic records reviewed and labs from his PCP reviewed.   Assessment & Plan: History of prostate cancer.  He is doing well with a low PSA.   I will have him return in 6 months with a PSA.  BPH with BOO with frequency and nocturia.  He is trying to cut back to tamsulosin 0.4mg  daily and is doing ok so far.  Med refilled.     Meds ordered this encounter  Medications   tamsulosin (FLOMAX) 0.4 MG CAPS capsule    Sig: Take 2 capsules (0.8 mg total) by mouth daily.    Dispense:  180 capsule    Refill:  3      Orders Placed This Encounter  Procedures   Microscopic Examination   Urinalysis, Routine w reflex microscopic   PSA    Standing Status:   Future    Standing Expiration Date:   05/22/2022      Return in about 6 months (around 11/20/2021) for with PSA.   CC: Renee Rival, NP      Irine Seal 05/22/2021 Patient ID: [name], male   DOB: [birth year], 70 y.o.   MRN: [id number] Patient ID: Ordell Prichett, male   DOB: 29-Aug-1950, 70 y.o.   MRN: [id number]

## 2021-08-05 ENCOUNTER — Other Ambulatory Visit: Payer: Self-pay

## 2021-11-27 ENCOUNTER — Encounter: Payer: Self-pay | Admitting: Urology

## 2021-11-27 ENCOUNTER — Ambulatory Visit: Payer: Medicare PPO | Admitting: Urology

## 2021-11-27 VITALS — BP 153/65 | HR 98

## 2021-11-27 DIAGNOSIS — Z8546 Personal history of malignant neoplasm of prostate: Secondary | ICD-10-CM

## 2021-11-27 MED ORDER — SILDENAFIL CITRATE 100 MG PO TABS: 100.0000 mg | ORAL_TABLET | Freq: Every day | ORAL | 5 refills | Status: DC | PRN

## 2021-11-27 NOTE — Progress Notes (Signed)
?Subjective: ? ?1. History of prostate cancer   ? ? 11/27/21: Donald Pollard returns today for his history of prostate cancer s/p seed implant on 11/11/17.  His PSA remains low at 0.1 in 12/22.  He remains on tamsulosin he is interested in trying to stop that.  His IPSS is 9 with nocturia 2-3x with some daytime frequency.  He has had no hematuria or dysuria.  He has no GI complaints.  His weight is down about 7 lb but he loses weight in the warm weather.   He has ED and responded to Viagra.  He has no other associated signs or symptoms.  ? ?HPI: Donald Pollard is a 71 year-old male established patient who is here for prostate cancer which has been treated. ? ?He received radiation therapy for his cancer. He was treated with Brachytherapy for his cancer. His radiation treatment was complete 11/11/2017.  His PSA on 01/27/21 was <0.1.  His UA has 0-2 RBC's.  His IPSS is 9.  He remains on tamsulosin and is taking and OTC prostate supplement.  He has nocturia x 3, but he doesn't wake with urge.  He has some mild daytime frequency and he has a good stream.  He has no incontinence.   He had been on oxybutynin '5mg'$  at bedtime but it didn't help and he is off of that.  He has had no hematuria.  He has no GI complaints.  ? ?Donald Pollard returns after undergoing radioactive seed implantation on 11/11/17. His last PSA was stable at 0.5 on 07/24/19 and says a more recent level was near 0 at his PCP's office.  It was 10.9 prior to therapy. He has stable LUTS but still has nocturia x 5 and has some frequency. He remains on tamsulosin and his symptoms worsened when he ran out.  He has had no further hematuria. He has no GI complaints. He has no bone pain or weight loss.  ? ?Previously he was found to have T2a Nx Mx Gleason 7(3+4) prostate cancer in 10% of the right base medial biopsy. The ROI biopsies were negative. His recent IPSS was only 5. The [facility] nomogram predicts 56% chance of OCD, 43% ECE, 2% LNI and 1% SVI. His prostate volume was  79m. He his PSA was 10.9. He had a prior biopsy in 1/16 for a PSA of 8.1. that biopsy was negative.  ? ?  ? ?  ? ?ROS: ? ?ROS:  ?A complete review of systems was performed.  All systems are negative except for pertinent findings as noted.  ? ?ROS ? ?No Known Allergies ? ?Outpatient Encounter Medications as of 11/27/2021  ?Medication Sig Note  ? aspirin 81 MG tablet Take 81 mg by mouth daily.   ? febuxostat (ULORIC) 40 MG tablet    ? hydrochlorothiazide (HYDRODIURIL) 25 MG tablet Take 25 mg by mouth daily. 11/13/2015: Received from: External Pharmacy Received Sig:   ? HYDROcodone-acetaminophen (NORCO) 5-325 MG tablet Take 1 tablet by mouth every 6 (six) hours as needed for moderate pain.   ? lisinopril (PRINIVIL,ZESTRIL) 10 MG tablet Take 10 mg by mouth daily. 11/13/2015: Received from: External Pharmacy Received Sig:   ? lovastatin (MEVACOR) 20 MG tablet Take 20 mg by mouth daily at 6 PM.  11/13/2015: Received from: External Pharmacy Received Sig:   ? predniSONE (DELTASONE) 20 MG tablet Take 1 tablet (20 mg total) by mouth 2 (two) times daily.   ? sildenafil (VIAGRA) 100 MG tablet Take 1 tablet (100 mg total)  by mouth daily as needed for erectile dysfunction.   ? tamsulosin (FLOMAX) 0.4 MG CAPS capsule Take 2 capsules (0.8 mg total) by mouth daily.   ? UNABLE TO FIND Stool softener 1 per day   ? ?No facility-administered encounter medications on file as of 11/27/2021.  ? ? ?Past Medical History:  ?Diagnosis Date  ? Arthritis   ? GERD (gastroesophageal reflux disease)   ? Gout   ? Hyperlipidemia   ? Hypertension   ? Prostate cancer ([place])   ? ? ?Past Surgical History:  ?Procedure Laterality Date  ? colonscopy    ? PROSTATE BIOPSY    ? RADIOACTIVE SEED IMPLANT N/A 11/11/2017  ? Procedure: RADIOACTIVE SEED IMPLANT/BRACHYTHERAPY IMPLANT;  Surgeon: Donald Seal, MD;  Location: [facility];  Service: Urology;  Laterality: N/A;  ? SPACE OAR INSTILLATION N/A 11/11/2017  ? Procedure: SPACE OAR INSTILLATION;  Surgeon:  Donald Seal, MD;  Location: [facility];  Service: Urology;  Laterality: N/A;  ? ? ?Social History  ? ?Socioeconomic History  ? Marital status: Single  ?  Spouse name: Not on file  ? Number of children: 3  ? Years of education: Not on file  ? Highest education level: Not on file  ?Occupational History  ? Occupation: retired  ?Tobacco Use  ? Smoking status: Every Day  ?  Packs/day: 1.50  ?  Years: 52.00  ?  Pack years: 78.00  ?  Types: Cigarettes  ? Smokeless tobacco: Never  ?Vaping Use  ? Vaping Use: Never used  ?Substance and Sexual Activity  ? Alcohol use: No  ?  Alcohol/week: 0.0 standard drinks  ? Drug use: No  ? Sexual activity: Yes  ?  Partners: Female  ?Other Topics Concern  ? Not on file  ?Social History Narrative  ? Patient has three daughters. Two are local. The youngest daughter lives in [place].   ? ?Social Determinants of Health  ? ?Financial Resource Strain: Not on file  ?Food Insecurity: Not on file  ?Transportation Needs: Not on file  ?Physical Activity: Not on file  ?Stress: Not on file  ?Social Connections: Not on file  ?Intimate Partner Violence: Not on file  ? ? ?Family History  ?Problem Relation Age of Onset  ? Cancer Brother   ?     prostate  ? Cancer Brother   ?     prostate  ? Breast cancer Neg Hx   ? Colon cancer Neg Hx   ? ? ? ? ? ?Objective: ?Vitals:  ? 11/27/21 1514  ?BP: (!) 153/65  ?Pulse: 98  ? ? ? ?Physical Exam ? ?Lab Results:  ?No results found for this or any previous visit (from the past 24 hour(s)). ?  ?BMET ?No results for input(s): NA, K, CL, CO2, GLUCOSE, BUN, CREATININE, CALCIUM in the last 72 hours. ?PSA ?No results found for: PSA ?No results found for: TESTOSTERONE ?No results found for this or any previous visit (from the past 24 hour(s)). ? ? ?UA is clear.  ? ?Studies/Results: ?PSA 0.5 on 07/24/19.   ?PSA 0.2 in 12/21.  ?PSA 0.1 in 12/22.  ?Epic records reviewed and labs from his PCP reviewed.  ? ?Assessment & Plan: ?History of prostate cancer.  He is doing  well with a low PSA.   I will have him return in 6 months with a PSA. ? ?BPH with BOO with frequency and nocturia.  He will try to come off of the tamsulosin 0.'4mg'$  daily. ? ?ED.  I have given a script for sildenafil '100mg'$  which he has taken previously.   ? ? ? ?Meds ordered this encounter  ?Medications  ? sildenafil (VIAGRA) 100 MG tablet  ?  Sig: Take 1 tablet (100 mg total) by mouth daily as needed for erectile dysfunction.  ?  Dispense:  30 tablet  ?  Refill:  5  ? ?  ? ?Orders Placed This Encounter  ?Procedures  ? Urinalysis, Routine w reflex microscopic  ?  ? ? ?Return in about 6 months (around 05/29/2022) for He will have a PSA done at [facility] family [facility]. . ? ? ?CC: Renee Rival, NP   ? ? ? ?Donald Pollard ?11/27/2021 ?Patient ID: Donald Pollard, male   DOB: 02-15-1951, 71 y.o.   MRN: [id number] ?Patient ID: Donald Pollard, male   DOB: 02-Jul-1951, 71 y.o.   MRN: [id number] ? ?

## 2021-11-28 LAB — URINALYSIS, ROUTINE W REFLEX MICROSCOPIC
Bilirubin, UA: NEGATIVE
Glucose, UA: NEGATIVE
Ketones, UA: NEGATIVE
Leukocytes,UA: NEGATIVE
Nitrite, UA: NEGATIVE
Protein,UA: NEGATIVE
RBC, UA: NEGATIVE
Specific Gravity, UA: >1.03 — ABNORMAL HIGH (ref 1.005–1.030)
Urobilinogen, Ur: 0.2 mg/dL (ref 0.2–1.0)
pH, UA: 5.5 (ref 5.0–7.5)

## 2022-02-10 ENCOUNTER — Other Ambulatory Visit: Payer: Self-pay

## 2022-03-05 LAB — COLOGUARD: COLOGUARD: NEGATIVE

## 2022-05-21 ENCOUNTER — Ambulatory Visit: Payer: Medicare PPO | Admitting: Urology

## 2022-06-04 ENCOUNTER — Ambulatory Visit: Payer: Medicare PPO | Admitting: Urology

## 2022-06-25 ENCOUNTER — Ambulatory Visit: Payer: Medicare PPO | Admitting: Urology

## 2022-06-25 VITALS — BP 141/77 | HR 79

## 2022-06-25 DIAGNOSIS — R350 Frequency of micturition: Secondary | ICD-10-CM | POA: Diagnosis not present

## 2022-06-25 DIAGNOSIS — N401 Enlarged prostate with lower urinary tract symptoms: Secondary | ICD-10-CM

## 2022-06-25 DIAGNOSIS — Z8546 Personal history of malignant neoplasm of prostate: Secondary | ICD-10-CM

## 2022-06-25 DIAGNOSIS — N5201 Erectile dysfunction due to arterial insufficiency: Secondary | ICD-10-CM

## 2022-06-25 DIAGNOSIS — R351 Nocturia: Secondary | ICD-10-CM

## 2022-06-25 DIAGNOSIS — N138 Other obstructive and reflux uropathy: Secondary | ICD-10-CM

## 2022-06-25 NOTE — Progress Notes (Signed)
Subjective:  1. History of prostate cancer   2. BPH with urinary obstruction   3. Urinary frequency   4. Nocturia   5. Erectile dysfunction due to arterial insufficiency    06/25/22: Savior returns today in f/u.  His PSA remained <0.1 on 02/06/22.  He has stable LUTS with an IPSS of 10 and nocturia x 3.  He was able to get off of tamsulosin following his last visit.  He has lost some weight but has no pain.  He has no GI complaint except for frequent stools.  He has ED and didn't benefit from sildenafil.    11/27/21: Mr. Bok returns today for his history of prostate cancer s/p seed implant on 11/11/17.  His PSA remains low at 0.1 in 12/22.  He remains on tamsulosin he is interested in trying to stop that.  His IPSS is 9 with nocturia 2-3x with some daytime frequency.  He has had no hematuria or dysuria.  He has no GI complaints.  His weight is down about 7 lb but he loses weight in the warm weather.   He has ED and responded to Viagra.  He has no other associated signs or symptoms.   HPI: Carsyn Taubman is a 71 year-old male established patient who is here for prostate cancer which has been treated.  He received radiation therapy for his cancer. He was treated with Brachytherapy for his cancer. His radiation treatment was complete 11/11/2017.  His PSA on 01/27/21 was <0.1.  His UA has 0-2 RBC's.  His IPSS is 9.  He remains on tamsulosin and is taking and OTC prostate supplement.  He has nocturia x 3, but he doesn't wake with urge.  He has some mild daytime frequency and he has a good stream.  He has no incontinence.   He had been on oxybutynin '5mg'$  at bedtime but it didn't help and he is off of that.  He has had no hematuria.  He has no GI complaints.   Mr. Lepak returns after undergoing radioactive seed implantation on 11/11/17. His last PSA was stable at 0.5 on 07/24/19 and says a more recent level was near 0 at his PCP's office.  It was 10.9 prior to therapy. He has stable LUTS but still has  nocturia x 5 and has some frequency. He remains on tamsulosin and his symptoms worsened when he ran out.  He has had no further hematuria. He has no GI complaints. He has no bone pain or weight loss.   Previously he was found to have T2a Nx Mx Gleason 7(3+4) prostate cancer in 10% of the right base medial biopsy. The ROI biopsies were negative. His recent IPSS was only 5. The [facility] nomogram predicts 56% chance of OCD, 43% ECE, 2% LNI and 1% SVI. His prostate volume was 20m. He his PSA was 10.9. He had a prior biopsy in 1/16 for a PSA of 8.1. that biopsy was negative.       IPSS     Row Name 06/25/22 1400         International Prostate Symptom Score   How often have you had the sensation of not emptying your bladder? About half the time     How often have you had to urinate less than every two hours? Less than half the time     How often have you found you stopped and started again several times when you urinated? Less than 1 in 5 times     How  often have you found it difficult to postpone urination? Not at All     How often have you had a weak urinary stream? Less than half the time     How often have you had to strain to start urination? Not at All     How many times did you typically get up at night to urinate? 3 Times     Total IPSS Score 11       Quality of Life due to urinary symptoms   If you were to spend the rest of your life with your urinary condition just the way it is now how would you feel about that? Mostly Satisfied               ROS:  ROS:  A complete review of systems was performed.  All systems are negative except for pertinent findings as noted.   Review of Systems  Respiratory:  Positive for cough.   Gastrointestinal:  Positive for heartburn.    No Known Allergies  Outpatient Encounter Medications as of 06/25/2022  Medication Sig Note   aspirin 81 MG tablet Take 81 mg by mouth daily.    febuxostat (ULORIC) 40 MG tablet     hydrochlorothiazide  (HYDRODIURIL) 25 MG tablet Take 25 mg by mouth daily. 11/13/2015: Received from: External Pharmacy Received Sig:    HYDROcodone-acetaminophen (NORCO) 5-325 MG tablet Take 1 tablet by mouth every 6 (six) hours as needed for moderate pain.    lisinopril (PRINIVIL,ZESTRIL) 10 MG tablet Take 10 mg by mouth daily. 11/13/2015: Received from: External Pharmacy Received Sig:    lovastatin (MEVACOR) 20 MG tablet Take 20 mg by mouth daily at 6 PM.  11/13/2015: Received from: External Pharmacy Received Sig:    predniSONE (DELTASONE) 20 MG tablet Take 1 tablet (20 mg total) by mouth 2 (two) times daily.    UNABLE TO FIND Stool softener 1 per day    [DISCONTINUED] sildenafil (VIAGRA) 100 MG tablet Take 1 tablet (100 mg total) by mouth daily as needed for erectile dysfunction.    [DISCONTINUED] tamsulosin (FLOMAX) 0.4 MG CAPS capsule Take 2 capsules (0.8 mg total) by mouth daily.    No facility-administered encounter medications on file as of 06/25/2022.    Past Medical History:  Diagnosis Date   Arthritis    GERD (gastroesophageal reflux disease)    Gout    Hyperlipidemia    Hypertension    Prostate cancer ([place])     Past Surgical History:  Procedure Laterality Date   colonscopy     PROSTATE BIOPSY     RADIOACTIVE SEED IMPLANT N/A 11/11/2017   Procedure: RADIOACTIVE SEED IMPLANT/BRACHYTHERAPY IMPLANT;  Surgeon: Irine Seal, MD;  Location[facility];  Service: Urology;  Laterality: N/A;   SPACE OAR INSTILLATION N/A 11/11/2017   Procedure: SPACE OAR INSTILLATION;  Surgeon: Irine Seal, MD;  Location: [facility];  Service: Urology;  Laterality: N/A;    Social History   Socioeconomic History   Marital status: Single    Spouse name: Not on file   Number of children: 3   Years of education: Not on file   Highest education level: Not on file  Occupational History   Occupation: retired  Tobacco Use   Smoking status: Every Day    Packs/day: 1.50    Years: 52.00     Total pack years: 78.00    Types: Cigarettes   Smokeless tobacco: Never  Vaping Use   Vaping Use: Never used  Substance and Sexual Activity   Alcohol use: No    Alcohol/week: 0.0 standard drinks of alcohol   Drug use: No   Sexual activity: Yes    Partners: Female  Other Topics Concern   Not on file  Social History Narrative   Patient has three daughters. Two are local. The youngest daughter lives in [place].    Social Determinants of Health   Financial Resource Strain: Not on file  Food Insecurity: Not on file  Transportation Needs: Not on file  Physical Activity: Not on file  Stress: Not on file  Social Connections: Not on file  Intimate Partner Violence: Not on file    Family History  Problem Relation Age of Onset   Cancer Brother        prostate   Cancer Brother        prostate   Breast cancer Neg Hx    Colon cancer Neg Hx        Objective: Vitals:   06/25/22 1408  BP: (!) 141/77  Pulse: 79     Physical Exam Vitals reviewed.  Constitutional:      Appearance: Normal appearance.  Neurological:     Mental Status: He is alert.     Lab Results:  No results found for this or any previous visit (from the past 24 hour(s)).   BMET No results for input(s): "NA", "K", "CL", "CO2", "GLUCOSE", "BUN", "CREATININE", "CALCIUM" in the last 72 hours. PSA No results found for: "PSA" No results found for: "TESTOSTERONE" No results found for this or any previous visit (from the past 24 hour(s)).   UA is clear.   Studies/Results: PSA 0.5 on 07/24/19.   PSA 0.2 in 12/21.  PSA 0.1 in 12/22.  PSA <0.1 in 6/23.  Epic records reviewed and labs from his PCP reviewed.   Assessment & Plan: History of prostate cancer.  He is doing well with a low PSA.   I will have him return in 6 months with a PSA.  BPH with BOO with frequency and nocturia.  He is doing well off of tamsulosin.   ED.  He didn't respond to sildenafil.       No orders of the defined types were  placed in this encounter.     Orders Placed This Encounter  Procedures   Urinalysis, Routine w reflex microscopic   PSA    Standing Status:   Future    Standing Expiration Date:   06/26/2023      Return in about 1 year (around 06/26/2023) for with PSA.   CC: Renee Rival, NP      Irine Seal 06/25/2022 Patient ID: [name], male   DOB: 04-23-1951, 71 y.o.   MRN: [id number] Patient ID: Brittian Renaldo, male   DOB: 1951-01-22, 71 y.o.   MRN: [id number]

## 2022-06-26 ENCOUNTER — Encounter: Payer: Self-pay | Admitting: Urology

## 2022-06-26 LAB — URINALYSIS, ROUTINE W REFLEX MICROSCOPIC
Bilirubin, UA: NEGATIVE
Glucose, UA: NEGATIVE
Leukocytes,UA: NEGATIVE
Nitrite, UA: NEGATIVE
RBC, UA: NEGATIVE
Specific Gravity, UA: 1.015 (ref 1.005–1.030)
Urobilinogen, Ur: 0.2 mg/dL (ref 0.2–1.0)
pH, UA: 5 (ref 5.0–7.5)

## 2023-07-01 ENCOUNTER — Encounter: Payer: Self-pay | Admitting: Urology

## 2023-07-01 ENCOUNTER — Ambulatory Visit (INDEPENDENT_AMBULATORY_CARE_PROVIDER_SITE_OTHER): Payer: 59 | Admitting: Urology

## 2023-07-01 VITALS — BP 144/66 | HR 77

## 2023-07-01 DIAGNOSIS — N5201 Erectile dysfunction due to arterial insufficiency: Secondary | ICD-10-CM

## 2023-07-01 DIAGNOSIS — R350 Frequency of micturition: Secondary | ICD-10-CM | POA: Diagnosis not present

## 2023-07-01 DIAGNOSIS — N401 Enlarged prostate with lower urinary tract symptoms: Secondary | ICD-10-CM

## 2023-07-01 DIAGNOSIS — Z8546 Personal history of malignant neoplasm of prostate: Secondary | ICD-10-CM | POA: Diagnosis not present

## 2023-07-01 DIAGNOSIS — R351 Nocturia: Secondary | ICD-10-CM | POA: Diagnosis not present

## 2023-07-01 DIAGNOSIS — N138 Other obstructive and reflux uropathy: Secondary | ICD-10-CM

## 2023-07-01 MED ORDER — TADALAFIL 20 MG PO TABS: 20.0000 mg | ORAL_TABLET | Freq: Every day | ORAL | 11 refills | Status: DC | PRN

## 2023-07-01 NOTE — Progress Notes (Signed)
Subjective:  1. History of prostate cancer   2. BPH with urinary obstruction   3. Urinary frequency   4. Nocturia   5. Erectile dysfunction due to arterial insufficiency    07/01/23: Aadhav returns today in f/u.  He is due for a PSA.   He thinks he had one in July but I don't have the results.  His IPSS is 8.  His UA is clear.  He has had no weight loss or bone pain.  He had no hematuria.  He has had some constipation but no bloody stools.   06/25/22: Juda returns today in f/u.  His PSA remained <0.1 on 02/06/22.  He has stable LUTS with an IPSS of 10 and nocturia x 3.  He was able to get off of tamsulosin following his last visit.  He has lost some weight but has no pain.  He has no GI complaint except for frequent stools.  He has ED and didn't benefit from sildenafil.    11/27/21: Mr. Ly returns today for his history of prostate cancer s/p seed implant on 11/11/17.  His PSA remains low at 0.1 in 12/22.  He remains on tamsulosin he is interested in trying to stop that.  His IPSS is 9 with nocturia 2-3x with some daytime frequency.  He has had no hematuria or dysuria.  He has no GI complaints.  His weight is down about 7 lb but he loses weight in the warm weather.   He has ED and responded to Viagra.  He has no other associated signs or symptoms.   HPI: Aramis Critchlow is a 72 year-old male established patient who is here for prostate cancer which has been treated.  He received radiation therapy for his cancer. He was treated with Brachytherapy for his cancer. His radiation treatment was complete 11/11/2017.  His PSA on 01/27/21 was <0.1.  His UA has 0-2 RBC's.  His IPSS is 9.  He remains on tamsulosin and is taking and OTC prostate supplement.  He has nocturia x 3, but he doesn't wake with urge.  He has some mild daytime frequency and he has a good stream.  He has no incontinence.   He had been on oxybutynin 5mg  at bedtime but it didn't help and he is off of that.  He has had no hematuria.  He has  no GI complaints.   Mr. Hohman returns after undergoing radioactive seed implantation on 11/11/17. His last PSA was stable at 0.5 on 07/24/19 and says a more recent level was near 0 at his PCP's office.  It was 10.9 prior to therapy. He has stable LUTS but still has nocturia x 5 and has some frequency. He remains on tamsulosin and his symptoms worsened when he ran out.  He has had no further hematuria. He has no GI complaints. He has no bone pain or weight loss.   Previously he was found to have T2a Nx Mx Gleason 7(3+4) prostate cancer in 10% of the right base medial biopsy. The ROI biopsies were negative. His recent IPSS was only 5. The [facility] nomogram predicts 56% chance of OCD, 43% ECE, 2% LNI and 1% SVI. His prostate volume was 58ml. He his PSA was 10.9. He had a prior biopsy in 1/16 for a PSA of 8.1. that biopsy was negative.          ROS:  ROS:  A complete review of systems was performed.  All systems are negative except for pertinent findings as noted.  Review of Systems  Gastrointestinal:  Positive for heartburn.  Skin:  Positive for rash.    No Known Allergies  Outpatient Encounter Medications as of 07/01/2023  Medication Sig Note   aspirin 81 MG tablet Take 81 mg by mouth daily.    febuxostat (ULORIC) 40 MG tablet     hydrochlorothiazide (HYDRODIURIL) 25 MG tablet Take 25 mg by mouth daily. 11/13/2015: Received from: External Pharmacy Received Sig:    HYDROcodone-acetaminophen (NORCO) 5-325 MG tablet Take 1 tablet by mouth every 6 (six) hours as needed for moderate pain.    lisinopril (PRINIVIL,ZESTRIL) 10 MG tablet Take 10 mg by mouth daily. 11/13/2015: Received from: External Pharmacy Received Sig:    lovastatin (MEVACOR) 20 MG tablet Take 20 mg by mouth daily at 6 PM.  11/13/2015: Received from: External Pharmacy Received Sig:    predniSONE (DELTASONE) 20 MG tablet Take 1 tablet (20 mg total) by mouth 2 (two) times daily.    tadalafil (CIALIS) 20 MG tablet Take 1 tablet (20  mg total) by mouth daily as needed for erectile dysfunction.    UNABLE TO FIND Stool softener 1 per day    No facility-administered encounter medications on file as of 07/01/2023.    Past Medical History:  Diagnosis Date   Arthritis    GERD (gastroesophageal reflux disease)    Gout    Hyperlipidemia    Hypertension    Prostate cancer (HCC)     Past Surgical History:  Procedure Laterality Date   colonscopy     PROSTATE BIOPSY     RADIOACTIVE SEED IMPLANT N/A 11/11/2017   Procedure: RADIOACTIVE SEED IMPLANT/BRACHYTHERAPY IMPLANT;  Surgeon: Bjorn Pippin, MD;  Location: [facility];  Service: Urology;  Laterality: N/A;   SPACE OAR INSTILLATION N/A 11/11/2017   Procedure: SPACE OAR INSTILLATION;  Surgeon: Bjorn Pippin, MD;  Location: [facility];  Service: Urology;  Laterality: N/A;    Social History   Socioeconomic History   Marital status: Single    Spouse name: Not on file   Number of children: 3   Years of education: Not on file   Highest education level: Not on file  Occupational History   Occupation: retired  Tobacco Use   Smoking status: Every Day    Current packs/day: 1.50    Average packs/day: 1.5 packs/day for 52.0 years (78.0 ttl pk-yrs)    Types: Cigarettes   Smokeless tobacco: Never  Vaping Use   Vaping status: Never Used  Substance and Sexual Activity   Alcohol use: No    Alcohol/week: 0.0 standard drinks of alcohol   Drug use: No   Sexual activity: Yes    Partners: Female  Other Topics Concern   Not on file  Social History Narrative   Patient has three daughters. Two are local. The youngest daughter lives in [place].    Social Determinants of Health   Financial Resource Strain: Not on file  Food Insecurity: Not on file  Transportation Needs: Not on file  Physical Activity: Not on file  Stress: Not on file  Social Connections: Not on file  Intimate Partner Violence: Not on file    Family History  Problem Relation  Age of Onset   Cancer Brother        prostate   Cancer Brother        prostate   Breast cancer Neg Hx    Colon cancer Neg Hx        Objective: Vitals:   07/01/23  1427  BP: (!) 144/66  Pulse: 77     Physical Exam Vitals reviewed.  Constitutional:      Appearance: Normal appearance.  Neurological:     Mental Status: He is alert.     Lab Results:  Results for orders placed or performed in visit on 07/01/23 (from the past 24 hour(s))  Urinalysis, Routine w reflex microscopic     Status: None   Collection Time: 07/01/23  2:27 PM  Result Value Ref Range   Specific Gravity, UA 1.025 1.005 - 1.030   pH, UA 6.0 5.0 - 7.5   Color, UA Yellow Yellow   Appearance Ur Clear Clear   Leukocytes,UA Negative Negative   Protein,UA Negative Negative/Trace   Glucose, UA Negative Negative   Ketones, UA Negative Negative   RBC, UA Negative Negative   Bilirubin, UA Negative Negative   Urobilinogen, Ur 0.2 0.2 - 1.0 mg/dL   Nitrite, UA Negative Negative   Microscopic Examination Comment    Narrative   Performed at:  [address] [facility] [address], [place], [place]  [id number] Lab Director: Chinita Pester MT, Phone:  [phone]     BMET No results for input(s): "NA", "K", "CL", "CO2", "GLUCOSE", "BUN", "CREATININE", "CALCIUM" in the last 72 hours. PSA No results found for: "PSA" No results found for: "TESTOSTERONE" Results for orders placed or performed in visit on 07/01/23 (from the past 24 hour(s))  Urinalysis, Routine w reflex microscopic     Status: None   Collection Time: 07/01/23  2:27 PM  Result Value Ref Range   Specific Gravity, UA 1.025 1.005 - 1.030   pH, UA 6.0 5.0 - 7.5   Color, UA Yellow Yellow   Appearance Ur Clear Clear   Leukocytes,UA Negative Negative   Protein,UA Negative Negative/Trace   Glucose, UA Negative Negative   Ketones, UA Negative Negative   RBC, UA Negative Negative   Bilirubin, UA Negative Negative   Urobilinogen, Ur 0.2 0.2 - 1.0  mg/dL   Nitrite, UA Negative Negative   Microscopic Examination Comment    Narrative   Performed at:  [address] [facility]  [address] [place], [place]  [id number] Lab Director: Chinita Pester MT, Phone:  [phone]     UA is clear.   Studies/Results: PSA 0.5 on 07/24/19.   PSA 0.2 in 12/21.  PSA 0.1 in 12/22.  PSA <0.1 in 6/23.  Epic records reviewed and labs from his PCP reviewed.   Assessment & Plan: History of prostate cancer.  PSA on 08/30/23 and in a year.   BPH with BOO with frequency and nocturia.  He is doing well off of tamsulosin.   ED.  He didn't respond to sildenafil.   He would like tadalafil.     Meds ordered this encounter  Medications   tadalafil (CIALIS) 20 MG tablet    Sig: Take 1 tablet (20 mg total) by mouth daily as needed for erectile dysfunction.    Dispense:  10 tablet    Refill:  11      Orders Placed This Encounter  Procedures   Urinalysis, Routine w reflex microscopic   PSA    Standing Status:   Future    Standing Expiration Date:   06/30/2024   PSA    Standing Status:   Future    Standing Expiration Date:   12/29/2023      Return in about 1 year (around 06/30/2024) for with PSA.Marland Kitchen   CC: Erasmo Downer, NP  Bjorn Pippin 07/02/2023 Patient ID: Lucia Bitter, male   DOB: [birth year], 72 y.o.   MRN: [id number] Patient ID: Lanis Hotte, male   DOB: 1951/08/02, 72 y.o.   MRN: [id number]

## 2023-07-02 LAB — URINALYSIS, ROUTINE W REFLEX MICROSCOPIC
Bilirubin, UA: NEGATIVE
Glucose, UA: NEGATIVE
Ketones, UA: NEGATIVE
Leukocytes,UA: NEGATIVE
Nitrite, UA: NEGATIVE
Protein,UA: NEGATIVE
RBC, UA: NEGATIVE
Specific Gravity, UA: 1.025 (ref 1.005–1.030)
Urobilinogen, Ur: 0.2 mg/dL (ref 0.2–1.0)
pH, UA: 6 (ref 5.0–7.5)

## 2023-08-23 ENCOUNTER — Ambulatory Visit: Admitting: Urology

## 2024-07-06 ENCOUNTER — Ambulatory Visit: Payer: BC Managed Care – PPO | Admitting: Urology

## 2024-08-21 NOTE — Progress Notes (Signed)
 "    Assessment: 1.  History of prostate cancer--grade group 2.  Status post brachytherapy March 2019.  Most recent PSA that I can see, done at [facility], was less than 0.1 in July 2025.  2.  BPH with symptoms.  Overall he is voiding well.  3.  ED, on tadalafil    Plan: 1.  Patient asked questions about bowel movements-she is constipated often.  I discussed increasing fluids and adding fiber supplements daily  2.  I will have him come back in 1 year for recheck   Subjective:  1.6.2026: Here for routine check.  PSA in July 2025 was less than 0.1.  No gross hematuria, no dysuria.  IPSS 17/2.    07/01/23: Donald Pollard today in f/u.  He is due for a PSA.   He thinks he had one in July but I don't have the results.  His IPSS is 8.  His UA is clear.  He has had no weight loss or bone pain.  He had no hematuria.  He has had some constipation but no bloody stools.   06/25/22: Donald Pollard today in f/u.  His PSA remained <0.1 on 02/06/22.  He has stable LUTS with an IPSS of 10 and nocturia x 3.  He was able to get off of tamsulosin  following his last visit.  He has lost some weight but has no pain.  He has no GI complaint except for frequent stools.  He has ED and didn't benefit from sildenafil .    11/27/21: Donald Pollard Pollard today for his history of prostate cancer s/p seed implant on 11/11/17.  His PSA remains low at 0.1 in 12/22.  He remains on tamsulosin  he is interested in trying to stop that.  His IPSS is 9 with nocturia 2-3x with some daytime frequency.  He has had no hematuria or dysuria.  He has no GI complaints.  His weight is down about 7 lb but he loses weight in the warm weather.   He has ED and responded to Viagra .  He has no other associated signs or symptoms.   HPI: Donald Pollard is a 74 year-old male established patient who is here for prostate cancer which has been treated.  He received radiation therapy for his cancer. He was treated with Brachytherapy for his  cancer. His radiation treatment was complete 11/11/2017.  His PSA on 01/27/21 was <0.1.  His UA has 0-2 RBC's.  His IPSS is 9.  He remains on tamsulosin  and is taking and OTC prostate supplement.  He has nocturia x 3, but he doesn't wake with urge.  He has some mild daytime frequency and he has a good stream.  He has no incontinence.   He had been on oxybutynin  5mg  at bedtime but it didn't help and he is off of that.  He has had no hematuria.  He has no GI complaints.   Donald Pollard Pollard after undergoing radioactive seed implantation on 11/11/17. His last PSA was stable at 0.5 on 07/24/19 and says a more recent level was near 0 at his PCP's office.  It was 10.9 prior to therapy. He has stable LUTS but still has nocturia x 5 and has some frequency. He remains on tamsulosin  and his symptoms worsened when he ran out.  He has had no further hematuria. He has no GI complaints. He has no bone pain or weight loss.   Previously he was found to have T2a Nx Mx Gleason 7(3+4) prostate cancer in  10% of the right base medial biopsy. The ROI biopsies were negative. His recent IPSS was only 5. The [facility] nomogram predicts 56% chance of OCD, 43% ECE, 2% LNI and 1% SVI. His prostate volume was 58ml. He his PSA was 10.9. He had a prior biopsy in 1/16 for a PSA of 8.1. that biopsy was negative.          ROS:  ROS:  A complete review of systems was performed.  All systems are negative except for pertinent findings as noted.   Review of Systems  Gastrointestinal:  Positive for heartburn.  Skin:  Positive for rash.    No Known Allergies  Outpatient Encounter Medications as of 08/22/2024  Medication Sig Note   aspirin 81 MG tablet Take 81 mg by mouth daily.    febuxostat (ULORIC) 40 MG tablet     hydrochlorothiazide (HYDRODIURIL) 25 MG tablet Take 25 mg by mouth daily. 11/13/2015: Received from: External Pharmacy Received Sig:    HYDROcodone -acetaminophen  (NORCO) 5-325 MG tablet Take 1 tablet by mouth every 6 (six)  hours as needed for moderate pain.    lisinopril (PRINIVIL,ZESTRIL) 10 MG tablet Take 10 mg by mouth daily. 11/13/2015: Received from: External Pharmacy Received Sig:    lovastatin (MEVACOR) 20 MG tablet Take 20 mg by mouth daily at 6 PM.  11/13/2015: Received from: External Pharmacy Received Sig:    predniSONE  (DELTASONE ) 20 MG tablet Take 1 tablet (20 mg total) by mouth 2 (two) times daily.    tadalafil  (CIALIS ) 20 MG tablet Take 1 tablet (20 mg total) by mouth daily as needed for erectile dysfunction.    UNABLE TO FIND Stool softener 1 per day    No facility-administered encounter medications on file as of 08/22/2024.    Past Medical History:  Diagnosis Date   Arthritis    GERD (gastroesophageal reflux disease)    Gout    Hyperlipidemia    Hypertension    Prostate cancer (HCC)     Past Surgical History:  Procedure Laterality Date   colonscopy     PROSTATE BIOPSY     RADIOACTIVE SEED IMPLANT N/A 11/11/2017   Procedure: RADIOACTIVE SEED IMPLANT/BRACHYTHERAPY IMPLANT;  Surgeon: Watt Rush, MD;  Location: [facility];  Service: Urology;  Laterality: N/A;   SPACE OAR INSTILLATION N/A 11/11/2017   Procedure: SPACE OAR INSTILLATION;  Surgeon: Watt Rush, MD;  Location: [facility];  Service: Urology;  Laterality: N/A;    Social History   Socioeconomic History   Marital status: Single    Spouse name: Not on file   Number of children: 3   Years of education: Not on file   Highest education level: Not on file  Occupational History   Occupation: retired  Tobacco Use   Smoking status: Every Day    Current packs/day: 1.50    Average packs/day: 1.5 packs/day for 52.0 years (78.0 ttl pk-yrs)    Types: Cigarettes   Smokeless tobacco: Never  Vaping Use   Vaping status: Never Used  Substance and Sexual Activity   Alcohol use: No    Alcohol/week: 0.0 standard drinks of alcohol   Drug use: No   Sexual activity: Yes    Partners: Female  Other Topics  Concern   Not on file  Social History Narrative   Patient has three daughters. Two are local. The youngest daughter lives in [place].    Social Drivers of Health   Tobacco Use: High Risk (07/01/2023)   Patient History  Smoking Tobacco Use: Every Day    Smokeless Tobacco Use: Never    Passive Exposure: Not on file  Financial Resource Strain: Not on file  Food Insecurity: Not on file  Transportation Needs: Not on file  Physical Activity: Not on file  Stress: Not on file  Social Connections: Not on file  Intimate Partner Violence: Not on file  Depression (EYV7-0): Not on file  Alcohol Screen: Not on file  Housing: Not on file  Utilities: Not on file  Health Literacy: Not on file    Family History  Problem Relation Age of Onset   Cancer Brother        prostate   Cancer Brother        prostate   Breast cancer Neg Hx    Colon cancer Neg Hx        Objective: There were no vitals filed for this visit.    Physical Exam Vitals reviewed.  Constitutional:      Appearance: Normal appearance.  Neurological:     Mental Status: He is alert.     Lab Results:  No results found for this or any previous visit (from the past 24 hours).    BMET No results for input(s): NA, K, CL, CO2, GLUCOSE, BUN, CREATININE, CALCIUM in the last 72 hours. PSA No results found for: PSA No results found for: TESTOSTERONE No results found for this or any previous visit (from the past 24 hours).    UA is clear.   Studies/Results: PSA 0.5 on 07/24/19.   PSA 0.2 in 12/21.  PSA 0.1 in 12/22.  PSA <0.1 in 6/23.   Most recent PCP records reviewed  PSA from 2025 reviewed  Pathology, prior notes reviewed  IPSS reviewed   "

## 2024-08-22 ENCOUNTER — Ambulatory Visit: Admitting: Urology

## 2024-08-22 VITALS — BP 120/66 | HR 80

## 2024-08-22 DIAGNOSIS — N401 Enlarged prostate with lower urinary tract symptoms: Secondary | ICD-10-CM

## 2024-08-22 DIAGNOSIS — N529 Male erectile dysfunction, unspecified: Secondary | ICD-10-CM | POA: Diagnosis not present

## 2024-08-22 DIAGNOSIS — N5201 Erectile dysfunction due to arterial insufficiency: Secondary | ICD-10-CM

## 2024-08-22 DIAGNOSIS — R350 Frequency of micturition: Secondary | ICD-10-CM

## 2024-08-22 DIAGNOSIS — Z8546 Personal history of malignant neoplasm of prostate: Secondary | ICD-10-CM | POA: Diagnosis not present

## 2024-08-22 LAB — URINALYSIS, ROUTINE W REFLEX MICROSCOPIC
Bilirubin, UA: NEGATIVE
Glucose, UA: NEGATIVE
Ketones, UA: NEGATIVE
Leukocytes,UA: NEGATIVE
Nitrite, UA: NEGATIVE
Protein,UA: NEGATIVE
RBC, UA: NEGATIVE
Specific Gravity, UA: 1.02 (ref 1.005–1.030)
Urobilinogen, Ur: 0.2 mg/dL (ref 0.2–1.0)
pH, UA: 6 (ref 5.0–7.5)

## 2024-08-22 MED ORDER — TADALAFIL 20 MG PO TABS: 20.0000 mg | ORAL_TABLET | Freq: Every day | ORAL | 11 refills | Status: AC | PRN
